# Patient Record
Sex: Male | Born: 1980 | Race: Black or African American | Hispanic: No | Marital: Single
Health system: Southern US, Community
[De-identification: ages and names within clinical notes are randomized; demographics above are authoritative.]

## PROBLEM LIST (undated history)

## (undated) ENCOUNTER — Emergency Department (HOSPITAL_COMMUNITY): Admission: EM | Payer: Self-pay | Source: Home / Self Care

## (undated) DIAGNOSIS — W3400XA Accidental discharge from unspecified firearms or gun, initial encounter: Secondary | ICD-10-CM

## (undated) HISTORY — PX: OTHER SURGICAL HISTORY: SHX169

---
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
## 2000-03-24 ENCOUNTER — Emergency Department (HOSPITAL_COMMUNITY): Admission: EM | Admit: 2000-03-24 | Discharge: 2000-03-24 | Payer: Self-pay | Admitting: Emergency Medicine

## 2002-12-22 ENCOUNTER — Emergency Department (HOSPITAL_COMMUNITY): Admission: EM | Admit: 2002-12-22 | Discharge: 2002-12-22 | Payer: Self-pay | Admitting: Emergency Medicine

## 2004-06-26 ENCOUNTER — Emergency Department (HOSPITAL_COMMUNITY): Admission: EM | Admit: 2004-06-26 | Discharge: 2004-06-26 | Payer: Self-pay

## 2004-10-28 ENCOUNTER — Emergency Department (HOSPITAL_COMMUNITY): Admission: EM | Admit: 2004-10-28 | Discharge: 2004-10-28 | Payer: Self-pay | Admitting: Family Medicine

## 2004-10-28 ENCOUNTER — Emergency Department (HOSPITAL_COMMUNITY): Admission: EM | Admit: 2004-10-28 | Discharge: 2004-10-28 | Payer: Self-pay | Admitting: Internal Medicine

## 2006-08-14 ENCOUNTER — Emergency Department (HOSPITAL_COMMUNITY): Admission: EM | Admit: 2006-08-14 | Discharge: 2006-08-14 | Payer: Self-pay | Admitting: Emergency Medicine

## 2006-09-07 ENCOUNTER — Inpatient Hospital Stay (HOSPITAL_COMMUNITY): Admission: AC | Admit: 2006-09-07 | Discharge: 2006-09-07 | Payer: Self-pay

## 2006-10-04 ENCOUNTER — Emergency Department (HOSPITAL_COMMUNITY): Admission: EM | Admit: 2006-10-04 | Discharge: 2006-10-04 | Payer: Self-pay | Admitting: Emergency Medicine

## 2007-07-19 ENCOUNTER — Emergency Department (HOSPITAL_COMMUNITY): Admission: EM | Admit: 2007-07-19 | Discharge: 2007-07-19 | Payer: Self-pay | Admitting: Emergency Medicine

## 2007-08-13 ENCOUNTER — Emergency Department (HOSPITAL_COMMUNITY): Admission: EM | Admit: 2007-08-13 | Discharge: 2007-08-13 | Payer: Self-pay | Admitting: Emergency Medicine

## 2007-12-28 ENCOUNTER — Emergency Department (HOSPITAL_COMMUNITY): Admission: EM | Admit: 2007-12-28 | Discharge: 2007-12-28 | Payer: Self-pay | Admitting: Emergency Medicine

## 2008-09-02 ENCOUNTER — Emergency Department (HOSPITAL_COMMUNITY): Admission: EM | Admit: 2008-09-02 | Discharge: 2008-09-02 | Payer: Self-pay | Admitting: Emergency Medicine

## 2009-05-23 ENCOUNTER — Emergency Department (HOSPITAL_COMMUNITY): Admission: EM | Admit: 2009-05-23 | Discharge: 2009-05-23 | Payer: Self-pay | Admitting: Emergency Medicine

## 2009-09-10 ENCOUNTER — Emergency Department (HOSPITAL_COMMUNITY): Admission: EM | Admit: 2009-09-10 | Discharge: 2009-09-10 | Payer: Self-pay | Admitting: Emergency Medicine

## 2009-09-13 ENCOUNTER — Emergency Department (HOSPITAL_COMMUNITY): Admission: EM | Admit: 2009-09-13 | Discharge: 2009-09-13 | Payer: Self-pay | Admitting: Family Medicine

## 2009-12-06 ENCOUNTER — Emergency Department (HOSPITAL_COMMUNITY): Admission: EM | Admit: 2009-12-06 | Discharge: 2009-12-06 | Payer: Self-pay | Admitting: Emergency Medicine

## 2010-12-25 LAB — DIFFERENTIAL
Basophils Absolute: 0 10*3/uL (ref 0.0–0.1)
Eosinophils Relative: 0 % (ref 0–5)
Monocytes Absolute: 0.6 10*3/uL (ref 0.1–1.0)
Neutro Abs: 3.4 10*3/uL (ref 1.7–7.7)
Neutrophils Relative %: 53 % (ref 43–77)

## 2010-12-25 LAB — URINALYSIS, ROUTINE W REFLEX MICROSCOPIC
Hgb urine dipstick: NEGATIVE
Protein, ur: NEGATIVE mg/dL
Specific Gravity, Urine: >1.03 — ABNORMAL HIGH (ref 1.005–1.030)
pH: 6 (ref 5.0–8.0)

## 2010-12-25 LAB — CBC
Hemoglobin: 14.1 g/dL (ref 13.0–17.0)
MCV: 88.5 fL (ref 78.0–100.0)
RBC: 4.71 MIL/uL (ref 4.22–5.81)
WBC: 6.4 10*3/uL (ref 4.0–10.5)

## 2010-12-25 LAB — COMPREHENSIVE METABOLIC PANEL
ALT: 16 U/L (ref 0–53)
Albumin: 4.1 g/dL (ref 3.5–5.2)
BUN: 9 mg/dL (ref 6–23)
GFR calc Af Amer: >60 mL/min (ref >60)

## 2010-12-25 LAB — GC/CHLAMYDIA PROBE AMP, URINE: GC Probe Amp, Urine: NEGATIVE

## 2011-06-20 LAB — COMPREHENSIVE METABOLIC PANEL
Albumin: 4.2 g/dL (ref 3.5–5.2)
BUN: 9 mg/dL (ref 6–23)
Creatinine, Ser: 1.34 mg/dL (ref 0.4–1.5)
GFR calc Af Amer: >60 mL/min (ref >60)
GFR calc non Af Amer: >60 mL/min (ref >60)
Sodium: 136 mEq/L (ref 135–145)
Total Bilirubin: 0.6 mg/dL (ref 0.3–1.2)

## 2011-06-20 LAB — CBC
MCHC: 34.6 g/dL (ref 30.0–36.0)
Platelets: 209 10*3/uL (ref 150–400)
RBC: 4.85 MIL/uL (ref 4.22–5.81)
WBC: 7.3 10*3/uL (ref 4.0–10.5)

## 2011-06-20 LAB — RAPID URINE DRUG SCREEN, HOSP PERFORMED
Amphetamines: NOT DETECTED
Barbiturates: NOT DETECTED
Benzodiazepines: NOT DETECTED
Cocaine: POSITIVE — AB
Opiates: NOT DETECTED
Tetrahydrocannabinol: POSITIVE — AB

## 2011-06-20 LAB — URINALYSIS, ROUTINE W REFLEX MICROSCOPIC
Leukocytes, UA: NEGATIVE
Nitrite: NEGATIVE
Protein, ur: >300 mg/dL — AB
Specific Gravity, Urine: 1.041 — ABNORMAL HIGH (ref 1.005–1.030)

## 2011-06-20 LAB — ETHANOL: Alcohol, Ethyl (B): 21 mg/dL — ABNORMAL HIGH (ref 0–10)

## 2011-06-20 LAB — URINE MICROSCOPIC-ADD ON

## 2011-06-20 LAB — DIFFERENTIAL
Basophils Absolute: 0.1 10*3/uL (ref 0.0–0.1)
Lymphs Abs: 2.7 10*3/uL (ref 0.7–4.0)

## 2011-10-10 ENCOUNTER — Encounter (HOSPITAL_COMMUNITY): Payer: Self-pay

## 2011-10-10 ENCOUNTER — Emergency Department (HOSPITAL_COMMUNITY)
Admission: EM | Admit: 2011-10-10 | Discharge: 2011-10-10 | Disposition: A | Discharge status: Home / Self Care | Payer: Self-pay | Attending: Emergency Medicine | Admitting: Emergency Medicine

## 2011-10-10 DIAGNOSIS — M795 Residual foreign body in soft tissue: Secondary | ICD-10-CM | POA: Insufficient documentation

## 2011-10-10 DIAGNOSIS — F17200 Nicotine dependence, unspecified, uncomplicated: Secondary | ICD-10-CM | POA: Insufficient documentation

## 2011-10-10 DIAGNOSIS — IMO0002 Reserved for concepts with insufficient information to code with codable children: Secondary | ICD-10-CM

## 2011-10-10 DIAGNOSIS — Z1889 Other specified retained foreign body fragments: Secondary | ICD-10-CM | POA: Insufficient documentation

## 2011-10-10 HISTORY — DX: Accidental discharge from unspecified firearms or gun, initial encounter: W34.00XA
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
## 2012-07-02 ENCOUNTER — Encounter (HOSPITAL_COMMUNITY): Payer: Self-pay | Admitting: *Deleted

## 2012-07-02 ENCOUNTER — Emergency Department (HOSPITAL_COMMUNITY)
Admission: EM | Admit: 2012-07-02 | Discharge: 2012-07-03 | Disposition: A | Discharge status: Home / Self Care | Payer: Self-pay | Attending: Emergency Medicine | Admitting: Emergency Medicine

## 2012-07-02 DIAGNOSIS — L732 Hidradenitis suppurativa: Secondary | ICD-10-CM

## 2012-07-02 DIAGNOSIS — F17200 Nicotine dependence, unspecified, uncomplicated: Secondary | ICD-10-CM | POA: Insufficient documentation

## 2012-07-02 DIAGNOSIS — IMO0002 Reserved for concepts with insufficient information to code with codable children: Secondary | ICD-10-CM | POA: Insufficient documentation

## 2012-07-02 NOTE — ED Notes (Signed)
Pt with abscess under right arm draining serosanguinous fluid.  Pt reports it has been there for a month.  Also patient has one on his upper left thigh.  Pt reports he has these chronically, but these are starting to make him feel bad all over.

## 2012-07-02 NOTE — ED Notes (Signed)
Pt c/o abscess under right arm x's 1 month.

## 2012-07-03 MED ORDER — HYDROCODONE-ACETAMINOPHEN 5-325 MG PO TABS: 1.0000 | ORAL_TABLET | Freq: Four times a day (QID) | ORAL | Status: DC | PRN

## 2012-07-03 MED ORDER — SULFAMETHOXAZOLE-TRIMETHOPRIM 800-160 MG PO TABS: 1.0000 | ORAL_TABLET | Freq: Two times a day (BID) | ORAL | Status: DC
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
## 2012-07-04 NOTE — ED Provider Notes (Signed)
Medical screening examination/treatment/procedure(s) were performed by non-physician practitioner and as supervising physician I was immediately available for consultation/collaboration.  Tania Perrott, MD 07/04/12 1119 

## 2012-07-06 ENCOUNTER — Encounter (HOSPITAL_COMMUNITY): Payer: Self-pay | Admitting: Emergency Medicine

## 2012-07-06 ENCOUNTER — Emergency Department (HOSPITAL_COMMUNITY)
Admission: EM | Admit: 2012-07-06 | Discharge: 2012-07-06 | Disposition: A | Discharge status: Home / Self Care | Payer: Self-pay | Attending: Emergency Medicine | Admitting: Emergency Medicine

## 2012-07-06 DIAGNOSIS — R109 Unspecified abdominal pain: Secondary | ICD-10-CM | POA: Insufficient documentation

## 2012-07-06 DIAGNOSIS — R1033 Periumbilical pain: Secondary | ICD-10-CM | POA: Insufficient documentation

## 2012-07-06 DIAGNOSIS — F17200 Nicotine dependence, unspecified, uncomplicated: Secondary | ICD-10-CM | POA: Insufficient documentation

## 2012-07-06 LAB — CBC WITH DIFFERENTIAL/PLATELET
Basophils Absolute: 0 10*3/uL (ref 0.0–0.1)
Basophils Relative: 0 % (ref 0–1)
Eosinophils Absolute: 0 10*3/uL (ref 0.0–0.7)
HCT: 41.2 % (ref 39.0–52.0)
Hemoglobin: 14.6 g/dL (ref 13.0–17.0)
MCHC: 35.4 g/dL (ref 30.0–36.0)
MCV: 85.5 fL (ref 78.0–100.0)
Neutro Abs: 3.8 10*3/uL (ref 1.7–7.7)
RBC: 4.82 MIL/uL (ref 4.22–5.81)
WBC: 6.1 10*3/uL (ref 4.0–10.5)

## 2012-07-06 LAB — URINALYSIS, MICROSCOPIC ONLY
Leukocytes, UA: NEGATIVE
Nitrite: NEGATIVE
Specific Gravity, Urine: 1.031 — ABNORMAL HIGH (ref 1.005–1.030)

## 2012-07-06 LAB — COMPREHENSIVE METABOLIC PANEL
Alkaline Phosphatase: 109 U/L (ref 39–117)
BUN: 15 mg/dL (ref 6–23)
Chloride: 101 mEq/L (ref 96–112)
Creatinine, Ser: 1.36 mg/dL — ABNORMAL HIGH (ref 0.50–1.35)
GFR calc Af Amer: 79 mL/min — ABNORMAL LOW (ref >90)
GFR calc non Af Amer: 68 mL/min — ABNORMAL LOW (ref >90)
Potassium: 4.5 mEq/L (ref 3.5–5.1)
Sodium: 136 mEq/L (ref 135–145)
Total Bilirubin: 0.2 mg/dL — ABNORMAL LOW (ref 0.3–1.2)

## 2012-07-06 MED ORDER — GI COCKTAIL ~~LOC~~
30.0000 mL | Freq: Once | ORAL | Status: AC
  Administered 2012-07-06: 30 mL via ORAL
  Filled 2012-07-06: qty 30

## 2012-07-06 MED ORDER — KETOROLAC TROMETHAMINE 30 MG/ML IJ SOLN
30.0000 mg | Freq: Once | INTRAMUSCULAR | Status: AC
  Administered 2012-07-06: 30 mg via INTRAVENOUS
  Filled 2012-07-06: qty 1

## 2012-07-06 NOTE — ED Provider Notes (Signed)
Medical screening examination/treatment/procedure(s) were performed by non-physician practitioner and as supervising physician I was immediately available for consultation/collaboration.   Ruthel Martine, MD 07/06/12 2352 
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
## 2012-07-06 NOTE — ED Notes (Signed)
Pt AAOx4, pacing in room.

## 2013-01-13 ENCOUNTER — Emergency Department (HOSPITAL_COMMUNITY)
Admission: EM | Admit: 2013-01-13 | Discharge: 2013-01-13 | Disposition: A | Discharge status: Home / Self Care | Payer: Self-pay | Attending: Emergency Medicine | Admitting: Emergency Medicine

## 2013-01-13 DIAGNOSIS — R197 Diarrhea, unspecified: Secondary | ICD-10-CM | POA: Insufficient documentation

## 2013-01-13 DIAGNOSIS — Z87828 Personal history of other (healed) physical injury and trauma: Secondary | ICD-10-CM | POA: Insufficient documentation

## 2013-01-13 DIAGNOSIS — F1010 Alcohol abuse, uncomplicated: Secondary | ICD-10-CM | POA: Insufficient documentation

## 2013-01-13 DIAGNOSIS — F17200 Nicotine dependence, unspecified, uncomplicated: Secondary | ICD-10-CM | POA: Insufficient documentation

## 2013-01-13 DIAGNOSIS — R1110 Vomiting, unspecified: Secondary | ICD-10-CM | POA: Insufficient documentation

## 2013-01-13 DIAGNOSIS — K852 Alcohol induced acute pancreatitis without necrosis or infection: Secondary | ICD-10-CM

## 2013-01-13 DIAGNOSIS — K859 Acute pancreatitis without necrosis or infection, unspecified: Secondary | ICD-10-CM | POA: Insufficient documentation

## 2013-01-13 LAB — CBC WITH DIFFERENTIAL/PLATELET
Basophils Relative: 0 % (ref 0–1)
HCT: 40.4 % (ref 39.0–52.0)
Hemoglobin: 13.8 g/dL (ref 13.0–17.0)
Lymphs Abs: 2.8 10*3/uL (ref 0.7–4.0)
MCH: 29.1 pg (ref 26.0–34.0)
MCHC: 34.2 g/dL (ref 30.0–36.0)
Monocytes Absolute: 0.5 10*3/uL (ref 0.1–1.0)
Monocytes Relative: 8 % (ref 3–12)
Neutro Abs: 3.1 10*3/uL (ref 1.7–7.7)
RBC: 4.75 MIL/uL (ref 4.22–5.81)

## 2013-01-13 LAB — COMPREHENSIVE METABOLIC PANEL
Albumin: 4 g/dL (ref 3.5–5.2)
Alkaline Phosphatase: 106 U/L (ref 39–117)
BUN: 12 mg/dL (ref 6–23)
Chloride: 103 mEq/L (ref 96–112)
Creatinine, Ser: 1.22 mg/dL (ref 0.50–1.35)
GFR calc Af Amer: >90 mL/min (ref >90)
GFR calc non Af Amer: 78 mL/min — ABNORMAL LOW (ref >90)
Glucose, Bld: 87 mg/dL (ref 70–99)
Total Bilirubin: 0.3 mg/dL (ref 0.3–1.2)

## 2013-01-13 LAB — URINALYSIS, MICROSCOPIC ONLY
Bilirubin Urine: NEGATIVE
Ketones, ur: NEGATIVE mg/dL
Nitrite: NEGATIVE
Protein, ur: NEGATIVE mg/dL
Urobilinogen, UA: 0.2 mg/dL (ref 0.0–1.0)
pH: 6 (ref 5.0–8.0)

## 2013-01-13 LAB — LIPASE, BLOOD: Lipase: 197 U/L — ABNORMAL HIGH (ref 11–59)

## 2013-01-13 MED ORDER — TRAMADOL HCL 50 MG PO TABS: 50.0000 mg | ORAL_TABLET | Freq: Four times a day (QID) | ORAL | Status: DC | PRN

## 2013-01-13 MED ORDER — SODIUM CHLORIDE 0.9 % IV BOLUS (SEPSIS)
1000.0000 mL | Freq: Once | INTRAVENOUS | Status: AC
Start: 2013-01-13 — End: 2013-01-13
  Administered 2013-01-13: 1000 mL via INTRAVENOUS

## 2013-01-13 NOTE — ED Notes (Signed)
Pt able to tolerate PO fluids.  PA made aware.
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
## 2013-01-13 NOTE — ED Notes (Signed)
Pt reports abdominal pain 9/10 x1 week. Vomited x2 today. Denies dysuria.

## 2013-01-15 NOTE — ED Provider Notes (Signed)
Medical screening examination/treatment/procedure(s) were performed by non-physician practitioner and as supervising physician I was immediately available for consultation/collaboration.   Gwyneth Sprout, MD 01/15/13 2245

## 2013-10-13 ENCOUNTER — Encounter (HOSPITAL_COMMUNITY): Payer: Self-pay | Admitting: Emergency Medicine

## 2013-10-13 ENCOUNTER — Emergency Department (HOSPITAL_COMMUNITY)
Admission: EM | Admit: 2013-10-13 | Discharge: 2013-10-13 | Disposition: A | Discharge status: Home / Self Care | Payer: Self-pay | Attending: Emergency Medicine | Admitting: Emergency Medicine

## 2013-10-13 DIAGNOSIS — H6120 Impacted cerumen, unspecified ear: Secondary | ICD-10-CM | POA: Insufficient documentation

## 2013-10-13 DIAGNOSIS — Z8781 Personal history of (healed) traumatic fracture: Secondary | ICD-10-CM | POA: Insufficient documentation

## 2013-10-13 DIAGNOSIS — Z87891 Personal history of nicotine dependence: Secondary | ICD-10-CM | POA: Insufficient documentation

## 2013-10-13 DIAGNOSIS — R6884 Jaw pain: Secondary | ICD-10-CM

## 2013-10-13 DIAGNOSIS — K029 Dental caries, unspecified: Secondary | ICD-10-CM | POA: Insufficient documentation

## 2013-10-13 DIAGNOSIS — Z9889 Other specified postprocedural states: Secondary | ICD-10-CM | POA: Insufficient documentation

## 2013-10-13 MED ORDER — IBUPROFEN 600 MG PO TABS: 600.0000 mg | ORAL_TABLET | Freq: Four times a day (QID) | ORAL | Status: DC | PRN

## 2013-10-13 MED ORDER — HYDROCODONE-ACETAMINOPHEN 5-325 MG PO TABS: 1.0000 | ORAL_TABLET | Freq: Four times a day (QID) | ORAL | Status: DC | PRN

## 2013-10-13 MED ORDER — AMOXICILLIN 500 MG PO CAPS: 500.0000 mg | ORAL_CAPSULE | Freq: Three times a day (TID) | ORAL | Status: DC

## 2013-10-13 NOTE — Discharge Instructions (Signed)
Ibuprofen for pain. norco for severe pain. Amoxil for infection until all gone. Follow up with an oral surgeon or ENT doctor.    Dental Pain A tooth ache may be caused by cavities (tooth decay). Cavities expose the nerve of the tooth to air and hot or cold temperatures. It may come from an infection or abscess (also called a boil or furuncle) around your tooth. It is also often caused by dental caries (tooth decay). This causes the pain you are having. DIAGNOSIS  Your caregiver can diagnose this problem by exam. TREATMENT   If caused by an infection, it may be treated with medications which kill germs (antibiotics) and pain medications as prescribed by your caregiver. Take medications as directed.  Only take over-the-counter or prescription medicines for pain, discomfort, or fever as directed by your caregiver.  Whether the tooth ache today is caused by infection or dental disease, you should see your dentist as soon as possible for further care. SEEK MEDICAL CARE IF: The exam and treatment you received today has been provided on an emergency basis only. This is not a substitute for complete medical or dental care. If your problem worsens or new problems (symptoms) appear, and you are unable to meet with your dentist, call or return to this location. SEEK IMMEDIATE MEDICAL CARE IF:   You have a fever.  You develop redness and swelling of your face, jaw, or neck.  You are unable to open your mouth.  You have severe pain uncontrolled by pain medicine. MAKE SURE YOU:   Understand these instructions.  Will watch your condition.  Will get help right away if you are not doing well or get worse. Document Released: 09/10/2005 Document Revised: 12/03/2011 Document Reviewed: 04/28/2008 [facility] Patient Information 2014 [employer], [place].
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
## 2013-10-13 NOTE — ED Notes (Signed)
Pt.been called twice no answer
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
## 2013-10-18 NOTE — ED Provider Notes (Signed)
Medical screening examination/treatment/procedure(s) were performed by non-physician practitioner and as supervising physician I was immediately available for consultation/collaboration.  EKG Interpretation   None        Haddie Bruhl W. Selmer Adduci, MD 10/18/13 1822 

## 2014-03-27 ENCOUNTER — Emergency Department (HOSPITAL_COMMUNITY)
Admission: EM | Admit: 2014-03-27 | Discharge: 2014-03-27 | Disposition: A | Discharge status: Home / Self Care | Payer: Self-pay | Attending: Emergency Medicine | Admitting: Emergency Medicine

## 2014-03-27 DIAGNOSIS — F411 Generalized anxiety disorder: Secondary | ICD-10-CM | POA: Insufficient documentation

## 2014-03-27 DIAGNOSIS — Z87828 Personal history of other (healed) physical injury and trauma: Secondary | ICD-10-CM | POA: Insufficient documentation

## 2014-03-27 DIAGNOSIS — Z87891 Personal history of nicotine dependence: Secondary | ICD-10-CM | POA: Insufficient documentation

## 2014-03-27 DIAGNOSIS — F1290 Cannabis use, unspecified, uncomplicated: Secondary | ICD-10-CM

## 2014-03-27 DIAGNOSIS — Z792 Long term (current) use of antibiotics: Secondary | ICD-10-CM | POA: Insufficient documentation

## 2014-03-27 DIAGNOSIS — F1410 Cocaine abuse, uncomplicated: Secondary | ICD-10-CM | POA: Insufficient documentation

## 2014-03-27 DIAGNOSIS — F419 Anxiety disorder, unspecified: Secondary | ICD-10-CM

## 2014-03-27 DIAGNOSIS — F1210 Cannabis abuse, uncomplicated: Secondary | ICD-10-CM | POA: Insufficient documentation

## 2014-03-27 LAB — CBC
HCT: 41.6 % (ref 39.0–52.0)
Hemoglobin: 14.2 g/dL (ref 13.0–17.0)
MCH: 28.8 pg (ref 26.0–34.0)
MCHC: 34.1 g/dL (ref 30.0–36.0)
MCV: 84.4 fL (ref 78.0–100.0)
PLATELETS: 206 10*3/uL (ref 150–400)
RBC: 4.93 MIL/uL (ref 4.22–5.81)
RDW: 13.3 % (ref 11.5–15.5)
WBC: 9.4 10*3/uL (ref 4.0–10.5)

## 2014-03-27 LAB — RAPID URINE DRUG SCREEN, HOSP PERFORMED
AMPHETAMINES: NOT DETECTED
Barbiturates: NOT DETECTED
Benzodiazepines: NOT DETECTED
Cocaine: POSITIVE — AB
OPIATES: NOT DETECTED
TETRAHYDROCANNABINOL: POSITIVE — AB

## 2014-03-27 LAB — COMPREHENSIVE METABOLIC PANEL
ALT: 12 U/L (ref 0–53)
AST: 14 U/L (ref 0–37)
Albumin: 4.2 g/dL (ref 3.5–5.2)
Alkaline Phosphatase: 110 U/L (ref 39–117)
Anion gap: 12 (ref 5–15)
BUN: 17 mg/dL (ref 6–23)
CALCIUM: 10.2 mg/dL (ref 8.4–10.5)
CHLORIDE: 101 meq/L (ref 96–112)
CO2: 27 meq/L (ref 19–32)
Creatinine, Ser: 1.46 mg/dL — ABNORMAL HIGH (ref 0.50–1.35)
GFR calc Af Amer: 71 mL/min — ABNORMAL LOW (ref >90)
GFR, EST NON AFRICAN AMERICAN: 62 mL/min — AB (ref >90)
Glucose, Bld: 127 mg/dL — ABNORMAL HIGH (ref 70–99)
Potassium: 4 mEq/L (ref 3.7–5.3)
SODIUM: 140 meq/L (ref 137–147)
Total Bilirubin: 0.2 mg/dL — ABNORMAL LOW (ref 0.3–1.2)
Total Protein: 8.1 g/dL (ref 6.0–8.3)

## 2014-03-27 LAB — ETHANOL: Alcohol, Ethyl (B): <11 mg/dL (ref 0–11)
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
## 2014-03-27 NOTE — ED Notes (Signed)
Pt transported via EMS from home for vague c/o "psych evaluation" pt stating he doesn't feel right.

## 2014-03-27 NOTE — ED Notes (Signed)
PT states he took a "drink" last night and is feeling strange. Denies si/hi. Pt alert and oriented.

## 2014-03-27 NOTE — Discharge Instructions (Signed)
Panic Attacks °Panic attacks are sudden, short-lived surges of severe anxiety, fear, or discomfort. They may occur for no reason when you are relaxed, when you are anxious, or when you are sleeping. Panic attacks may occur for a number of reasons:  °· Healthy people occasionally have panic attacks in extreme, life-threatening situations, such as war or natural disasters. Normal anxiety is a protective mechanism of the body that helps us react to danger (fight or flight response). °· Panic attacks are often seen with anxiety disorders, such as panic disorder, social anxiety disorder, generalized anxiety disorder, and phobias. Anxiety disorders cause excessive or uncontrollable anxiety. They may interfere with your relationships or other life activities. °· Panic attacks are sometimes seen with other mental illnesses, such as depression and posttraumatic stress disorder. °· Certain medical conditions, prescription medicines, and drugs of abuse can cause panic attacks. °SYMPTOMS  °Panic attacks start suddenly, peak within 20 minutes, and are accompanied by four or more of the following symptoms: °· Pounding heart or fast heart rate (palpitations). °· Sweating. °· Trembling or shaking. °· Shortness of breath or feeling smothered. °· Feeling choked. °· Chest pain or discomfort. °· Nausea or strange feeling in your stomach. °· Dizziness, light-headedness, or feeling like you will faint. °· Chills or hot flushes. °· Numbness or tingling in your lips or hands and feet. °· Feeling that things are not real or feeling that you are not yourself. °· Fear of losing control or going crazy. °· Fear of dying. °Some of these symptoms can mimic serious medical conditions. For example, you may think you are having a heart attack. Although panic attacks can be very scary, they are not life threatening. °DIAGNOSIS  °Panic attacks are diagnosed through an assessment by your health care provider. Your health care provider will ask  questions about your symptoms, such as where and when they occurred. Your health care provider will also ask about your medical history and use of alcohol and drugs, including prescription medicines. Your health care provider may order blood tests or other studies to rule out a serious medical condition. Your health care provider may refer you to a mental health professional for further evaluation. °TREATMENT  °· Most healthy people who have one or two panic attacks in an extreme, life-threatening situation will not require treatment. °· The treatment for panic attacks associated with anxiety disorders or other mental illness typically involves counseling with a mental health professional, medicine, or a combination of both. Your health care provider will help determine what treatment is best for you. °· Panic attacks due to physical illness usually go away with treatment of the illness. If prescription medicine is causing panic attacks, talk with your health care provider about stopping the medicine, decreasing the dose, or substituting another medicine. °· Panic attacks due to alcohol or drug abuse go away with abstinence. Some adults need professional help in order to stop drinking or using drugs. °HOME CARE INSTRUCTIONS  °· Take all medicines as directed by your health care provider.   °· Schedule and attend follow-up visits as directed by your health care provider. It is important to keep all your appointments. °SEEK MEDICAL CARE IF: °· You are not able to take your medicines as prescribed. °· Your symptoms do not improve or get worse. °SEEK IMMEDIATE MEDICAL CARE IF:  °· You experience panic attack symptoms that are different than your usual symptoms. °· You have serious thoughts about hurting yourself or others. °· You are taking medicine for panic attacks and   have a serious side effect. MAKE SURE YOU:  Understand these instructions.  Will watch your condition.  Will get help right away if you are not  doing well or get worse. Document Released: 09/10/2005 Document Revised: 09/15/2013 Document Reviewed: 04/24/2013 [facility] Patient Information 2015 [place], [place]. This information is not intended to replace advice given to you by your health care provider. Make sure you discuss any questions you have with your health care provider.  Marijuana Abuse and Chemical Dependency WHEN IS DRUG USE A PROBLEM? Problems related to drug use usually begin with abuse of the substance and lead to dependency.  Abuse is repeated use of a drug with recurrent and significant negative consequences. Abuse happens anytime drug use is interfering with normal living activities including:   Failure to fulfill major obligations at work, school or home (poor work International aid/development worker, missing work or school and/or neglecting children and home).  Engaging in activities that are physically dangerous (driving a car or doing recreational activities such as swimming or rock climbing) while under the effects of the drug.  Recurrent drug-related legal problems (arrests for disorderly conduct or assault and battery).  Recurrent social or interpersonal problems caused or increased by the effects of the drug (arguments with family or friends, or physical fights). Dependency has two parts.   You first develop an emotional/psychological dependence. Psychological dependence develops when your mind tells you that the drug is needed. You come to believe it helps you cope with life.  This is usually followed by physical dependence which has developed when continuing increases of drugs are required to get the same feeling or "high." This may result in:  Withdrawal symptoms such as shakes or tremors.  The substance being over a longer period of time than intended.  An ongoing desire, or unsuccessful effort to, cut down or control the use.  Greater amounts of time spent getting the drug, using the drug or recovering from the effects of the  drug.  Important social, work or interests and activities are given up or reduced because or drug use.  Substance is used despite knowledge of ongoing physical (ulcers) or psychological (depression) problems. SIGNS OF CHEMICAL DEPENDENCY:  Friends or family say there is a problem.  Fighting when using drugs.  Having blackouts (not remembering what you do while using).  Feel sick from using drugs but continue using.  Lie about use or amounts of drugs used.  Need drugs to get you going.  Need drugs to relate to people or feel comfortable in social situations.  Use drugs to forget problems. A "yes" answered to any of the above signs of chemical dependency indicates there are problems. The longer the use of drugs continues, the greater the problems will become. If there is a family history of drug or alcohol use it is best not to experiment with drugs. Experimentation leads to tolerance. Addiction is followed by dependency where drugs are now needed not just to get high but to feel normal. Addiction cannot be cured but it can be stopped. This often requires outside help and the care of professionals. Treatment centers are listed in the yellow pages under: Cocaine, Narcotics, and Alcoholics anonymous. Most hospitals and clinics can refer you to a specialized care center. WHAT IS MARIJUANA? Marijuana is a plant which grows wild all over the world. The plant contains many chemicals but the active ingredient of the plant is THC (tetrahydrocannabinol). This is responsible for the "high" perceived by people using the drug. HOW IS  MARIJUANA USED? Marijuana is smoked, eaten in brownies or any other food, and drank as a tea. WHAT ARE THE EFFECTS OF MARIJUANA? Marijuana is a nervous system depressant which slows the thinking process. Because of this effect, users think marijuana has a calming effect. Actually what happens is the air carrying tubules in the lung become relaxed and allow more oxygen to  enter. This causes the user to feel high. The blood pressure falls so less blood reaches the brain and the heart speeds up. As the effects wear off the user becomes depressed. Some people become very paranoid during use. They feel as though people are out to get them. Periodic use can interfere with performance at school or work. Generally Marijuana use does not develop into a physical dependence, but it is very habit forming. Marijuana is also seen as a gateway to use of harder drugs. Strong habits such as using Marijuana, as with all drugs and addictions, can only be helped by stopping use of all chemicals. This is hard but may save your life.  OTHER HEALTH RISKS OF MARIJUANA AND DRUG USE ARE: The increased possibility of getting AIDS or hepatitis (liver inflammation).  HOW TO STAY DRUG FREE ONCE YOU HAVE QUIT USING:  Develop healthy activities and form friends who do not use drugs.  Stay away from the drug scene.  Tell the those who want you to use drugs you have other, better things to do.  Have ready excuses available about why you cannot use.  Attend 12-Step Meetings for support from other recovering people. FOR MORE HELP OR INFORMATION CONTACT YOUR LOCAL CAREGIVER, CLINIC, OR HOSPITAL. Document Released: 09/07/2000 Document Revised: 01/05/2013 Document Reviewed: 10/08/2007 [facility] Patient Information 2015 [place], [place]. This information is not intended to replace advice given to you by your health care provider. Make sure you discuss any questions you have with your health care provider.    Emergency Department Resource Guide 1) Find a Doctor and Pay Out of Pocket Although you won't have to find out who is covered by your insurance plan, it is a good idea to ask around and get recommendations. You will then need to call the office and see if the doctor you have chosen will accept you as a new patient and what types of options they offer for patients who are self-pay. Some doctors  offer discounts or will set up payment plans for their patients who do not have insurance, but you will need to ask so you aren't surprised when you get to your appointment.  2) Contact Your Local Health Department Not all health departments have doctors that can see patients for sick visits, but many do, so it is worth a call to see if yours does. If you don't know where your local health department is, you can check in your phone book. The CDC also has a tool to help you locate your state's health department, and many state websites also have listings of all of their local health departments.  3) Find a Walk-in Clinic If your illness is not likely to be very severe or complicated, you may want to try a walk in clinic. These are popping up all over the country in pharmacies, drugstores, and shopping centers. They're usually staffed by nurse practitioners or physician assistants that have been trained to treat common illnesses and complaints. They're usually fairly quick and inexpensive. However, if you have serious medical issues or chronic medical problems, these are probably not your best option.  No  Primary Care Doctor: - Call Health Connect at  [phone] - they can help you locate a primary care doctor that  accepts your insurance, provides certain services, etc. - Physician Referral Service- [phone]  Chronic Pain Problems: Organization         Address  Phone   Notes  [facility]  [phone] Patients need to be referred by their primary care doctor.   Medication Assistance: Organization         Address  Phone   Notes  [facility] Medication [facility] [address] [place], [place] [address] [phone] --Must be a resident of [facility] -- Must have NO insurance coverage whatsoever (no Medicaid/ Medicare, etc.) -- The pt. MUST have a primary care doctor that directs their care regularly and follows them in the community    [facility]ist  [phone]   [employer]  [phone]    Agencies that provide inexpensive medical care: Organization         Address  Phone   Notes  [facility]  [phone]   [facility]    [phone]   [facility] [address] [place], [place] [address] [phone]   [facility] [address] [place], [place] [phone]   [facility]anned Parenthood    [phone]   [facility]    [phone]   [facility]  [address], [place] Phone:  [phone], Fax:  [phone] Hours of Operation:  9 am - 6 pm, M-F.  Also accepts Medicaid/Medicare and self-pay.  [facility]  [address], [place] Phone: [phone], Fax: [phone]. Hours of Operation:  8:30 am - 5:30 pm, M-F.  Also accepts Medicaid and self-pay.  [facility] [place] [address] [place] Phone: [phone]   [facility] [address] Natasha Bence [place], [place] [phone], Ext. [phone] Mondays & Thursdays: 7-9 AM.  First 15 patients are seen on a first come, first serve basis.    Medicaid-accepting [facility] Providers:  Organization         Address  Phone   Notes  [facility] [address], [place] [phone] Also accepts self-pay patients.  [facility] [address] Laurell Josephs [place], [place]  [phone]   [facility] [address], [place] [phone]   [facility] [address], [place] [phone]   Renaye Rakers [address], [place]   [phone] Only accepts [place] patients after they have their name applied to their card.   Self-Pay (no insurance) in [facility]:  Organization         Address  Phone   Notes  Sickle Cell Patients, [facility] [address] [place], [place] [phone]   [facility] [address] [place], [place] [phone]   [facility] [place]  [address],  [phone]   [facility]/Dr. Osei-Bonsu  [address] [place] or [address], [place] [phone] Phone number for both [place] and [place] locations is the same.  [facility] [address] [place]inette Otto [phone]  [facility] [address], [place] or [address] [phone]   [facility] [address] [place], [place] [phone], phone; [phone], fax Sees patients 1st and 3rd Saturday of every month.  Must not qualify for public or private insurance (i.e. Medicaid, Medicare, [place] Health Choice, Veterans' Benefits)  Household income should be no more than 200% of the poverty level The clinic cannot treat you if you are pregnant or think you are pregnant  Sexually transmitted diseases are not treated at the clinic.    Dental Care: Organization         Address  Phone  Notes  [facility] [address] [place], [place] [phone] Accepts children up to age 65 who are enrolled in [place] or [place] Health Choice; pregnant women with a Medicaid card; and children who have applied for Medicaid or [place] Health Choice, but were declined, whose parents can pay a reduced fee at time of service.  [facility] of [facility]  [address], [place] [phone] Accepts children up to age 62 who are enrolled in [place] or [place] Health Choice; pregnant women with a Medicaid card; and children who have applied for Medicaid or [place] Health Choice, but were declined, whose parents can pay a reduced fee at time of service.  [facility]  [address]  [place], [place] [phone] Patients are seen by appointment only. Walk-ins are not accepted. [facility] will see patients 60 years of age and older. Monday - Tuesday (8am-5pm) Most Wednesdays (8:30-5pm) $30 per visit, cash only  [facility] Access [facility]  [address], [facility] [phone] Patients are seen by appointment only. Walk-ins are not accepted. [facility] will see patients 30 years of age and older. One Wednesday Evening (Monthly: Volunteer Based).  $30 per visit, cash only  [employer]  [phone] for adults; Children under age 25, call Graduate Pediatric Dentistry at [phone]. Children aged 56-14, please call [phone] to request a pediatric application.  Dental services are provided in all areas of dental care including fillings, crowns and bridges, complete and partial dentures, implants, gum treatment, root canals, and extractions. Preventive care is also provided. Treatment is provided to both adults and children. Patients are selected via a lottery and there is often a waiting list.   [facility] [address], [place]  [phone] [email or web address]   [facility] [address] [place], [place] [phone], Ext. [phone] Second and Fourth Thursday of each month, opens at 6:30 AM; Clinic ends at 9 AM.  Patients are seen on a first-come first-served basis, and a limited number are seen during each clinic.   [facility]  [address] Ether Griffins [place], [place] [phone]   Eligibility Requirements You must have lived in [place], [place], or [place] counties for at least the last three months.   You cannot be eligible for state or federal sponsored [place], including [employer], [place], or [employer].   You generally cannot be eligible for healthcare insurance through your employer.    How to apply: Eligibility screenings are held every Tuesday and  Wednesday afternoon from 1:00 pm until 4:00 pm. You do not need an appointment for the interview!  [facility] [address] [place], [place] [phone]  [facility]  [phone]   [facility]  [phone]   [facility]  [phone]    Behavioral Health Resources in the Community: Intensive Outpatient Programs Organization         Address  Phone  Notes  [facility] [address], [place], [place] [phone]   [facility] [address], [place], [place] [phone]   [facility] [address], [place], [place]  [phone]   [facility] [address],  [place], [place] [phone] or [phone]   Substance Abuse Resources Organization         Address  Phone  Notes  [facility]  [phone]   [facility]  [phone]   [facility]  [phone]   [facility]  [phone]   Residential & Outpatient Substance Abuse Program  [phone]   Psychological Services Organization         Address  Phone  Notes  [facility]  [phone]   [facility]  [phone]- [phone]   [facility] [address], [place] [phone] or [phone]    Mobile Crisis Teams Organization         Address  Phone  Notes  [facility], Mobile Crisis Care Unit  [phone]   [facility]  [address] [place], [place] [phone]   Doristine LocksSharon DeEsch [address] [place] [phone]    Self-Help/Support Groups Organization         Address  Phone             Notes  [facility] [place] - variety of support groups  [phone] Call for more information  Narcotics Anonymous (NA), [facility] [address], [place]  2 meetings at this location   Risk manageresidential  Treatment Programs Organization         Address  Phone  Notes  [facility] [address],    [place]  [phone]   [facility]  [address], [place] [phone], [place], [place] [phone]   [facility] [address], [place] [phone] Admissions: 8am-3pm M-F  [facility] [address]    [place], [place] [phone]   [facility] [address], [place], [place] [phone]   [facility] [address]  [place], [place] [phone]   [facility] - Intensive Outpatient [address], [place], [place] [phone]   [facility] ([facility] [address]  [place], [place] [phone] or [phone]   [facility] ([facility]) [address] [place], [place] [phone] Accepts Medicaid  [facility] [address]  [place] [phone] Substance Abuse/Addiction Treatment   [facility] Resources Organization         Address  Phone  Notes  [facility]  [phone]   Angie FavaJulie Brannon, PhD [address], [address] [place], [place]   [phone] or [phone]) [phone]   [facility]   [address] [place], [place] [phone]   [facility] [address], [place], [place] [phone]) [phone] Insurance/Medicaid/sponsorship through [employer] [facility]  [facility] [address]  [place], [place] [phone] Therapy/tele-psych/case  [facility] [address]   [place], [place] [phone]    Dr. Lolly Mustache  [phone]   [facility]  [employer] [facility] Dept. 1) [address], [place] 2) [address] [place] 3)  [address], [place] [phone]  [phone]   [facility] [place] Child Abuse Hotline [phone] or [phone] (After Hours)

## 2014-03-27 NOTE — ED Notes (Addendum)
Pt reports "just doesn't feel right." Pt describes "feeling dayshavoo and clear headedness " but denies auditory/visual hallucinations. Pt reports "thinking mean thoughts" but unable to provide example. Pt denies SI/HI. Pt denies pain. Pt request psychiatric evaluation.

## 2014-03-27 NOTE — ED Notes (Signed)
MD at bedside. 

## 2014-06-09 ENCOUNTER — Encounter (HOSPITAL_COMMUNITY): Payer: Self-pay | Admitting: Emergency Medicine

## 2014-06-09 ENCOUNTER — Emergency Department (HOSPITAL_COMMUNITY)
Admission: EM | Admit: 2014-06-09 | Discharge: 2014-06-09 | Disposition: A | Discharge status: Home / Self Care | Payer: Self-pay | Attending: Emergency Medicine | Admitting: Emergency Medicine

## 2014-06-09 DIAGNOSIS — Z87891 Personal history of nicotine dependence: Secondary | ICD-10-CM | POA: Insufficient documentation

## 2014-06-09 DIAGNOSIS — K068 Other specified disorders of gingiva and edentulous alveolar ridge: Secondary | ICD-10-CM

## 2014-06-09 DIAGNOSIS — K1370 Unspecified lesions of oral mucosa: Secondary | ICD-10-CM | POA: Insufficient documentation

## 2014-06-09 DIAGNOSIS — K089 Disorder of teeth and supporting structures, unspecified: Secondary | ICD-10-CM | POA: Insufficient documentation

## 2014-06-09 DIAGNOSIS — Z87828 Personal history of other (healed) physical injury and trauma: Secondary | ICD-10-CM | POA: Insufficient documentation

## 2014-06-09 DIAGNOSIS — R6884 Jaw pain: Secondary | ICD-10-CM | POA: Insufficient documentation

## 2014-06-09 MED ORDER — PENICILLIN V POTASSIUM 500 MG PO TABS: 500.0000 mg | ORAL_TABLET | Freq: Four times a day (QID) | ORAL | Status: AC

## 2014-06-09 MED ORDER — NAPROXEN 500 MG PO TABS: 500.0000 mg | ORAL_TABLET | Freq: Two times a day (BID) | ORAL | Status: DC

## 2014-06-09 NOTE — Discharge Instructions (Signed)
Dental Care and Dentist Visits °Dental care supports good overall health. Regular dental visits can also help you avoid dental pain, bleeding, infection, and other more serious health problems in the future. It is important to keep the mouth healthy because diseases in the teeth, gums, and other oral tissues can spread to other areas of the body. Some problems, such as diabetes, heart disease, and pre-term labor have been associated with poor oral health.  °See your dentist every 6 months. If you experience emergency problems such as a toothache or broken tooth, go to the dentist right away. If you see your dentist regularly, you may catch problems early. It is easier to be treated for problems in the early stages.  °WHAT TO EXPECT AT A DENTIST VISIT  °Your dentist will look for many common oral health problems and recommend proper treatment. At your regular dental visit, you can expect: °· Gentle cleaning of the teeth and gums. This includes scraping and polishing. This helps to remove the sticky substance around the teeth and gums (plaque). Plaque forms in the mouth shortly after eating. Over time, plaque hardens on the teeth as tartar. If tartar is not removed regularly, it can cause problems. Cleaning also helps remove stains. °· Periodic X-rays. These pictures of the teeth and supporting bone will help your dentist assess the health of your teeth. °· Periodic fluoride treatments. Fluoride is a natural mineral shown to help strengthen teeth. Fluoride treatment involves applying a fluoride gel or varnish to the teeth. It is most commonly done in children. °· Examination of the mouth, tongue, jaws, teeth, and gums to look for any oral health problems, such as: °¨ Cavities (dental caries). This is decay on the tooth caused by plaque, sugar, and acid in the mouth. It is best to catch a cavity when it is small. °¨ Inflammation of the gums caused by plaque buildup (gingivitis). °¨ Problems with the mouth or malformed  or misaligned teeth. °¨ Oral cancer or other diseases of the soft tissues or jaws.  °KEEP YOUR TEETH AND GUMS HEALTHY °For healthy teeth and gums, follow these general guidelines as well as your dentist's specific advice: °· Have your teeth professionally cleaned at the dentist every 6 months. °· Brush twice daily with a fluoride toothpaste. °· Floss your teeth daily.  °· Ask your dentist if you need fluoride supplements, treatments, or fluoride toothpaste. °· Eat a healthy diet. Reduce foods and drinks with added sugar. °· Avoid smoking. °TREATMENT FOR ORAL HEALTH PROBLEMS °If you have oral health problems, treatment varies depending on the conditions present in your teeth and gums. °· Your caregiver will most likely recommend good oral hygiene at each visit. °· For cavities, gingivitis, or other oral health disease, your caregiver will perform a procedure to treat the problem. This is typically done at a separate appointment. Sometimes your caregiver will refer you to another dental specialist for specific tooth problems or for surgery. °SEEK IMMEDIATE DENTAL CARE IF: °· You have pain, bleeding, or soreness in the gum, tooth, jaw, or mouth area. °· A permanent tooth becomes loose or separated from the gum socket. °· You experience a blow or injury to the mouth or jaw area. °Document Released: 05/23/2011 Document Revised: 12/03/2011 Document Reviewed: 05/23/2011 °ExitCare® Patient Information ©2015 [employer]. This information is not intended to replace advice given to you by your health care provider. Make sure you discuss any questions you have with your health care provider. ° °Emergency Department Resource Guide °1) Find a Doctor   and Pay Out of Pocket °Although you won't have to find out who is covered by your insurance plan, it is a good idea to ask around and get recommendations. You will then need to call the office and see if the doctor you have chosen will accept you as a new patient and what types of  options they offer for patients who are self-pay. Some doctors offer discounts or will set up payment plans for their patients who do not have insurance, but you will need to ask so you aren't surprised when you get to your appointment. ° °2) Contact Your Local Health Department °Not all health departments have doctors that can see patients for sick visits, but many do, so it is worth a call to see if yours does. If you don't know where your local health department is, you can check in your phone book. The CDC also has a tool to help you locate your state's health department, and many state websites also have listings of all of their local health departments. ° °3) Find a Walk-in Clinic °If your illness is not likely to be very severe or complicated, you may want to try a walk in clinic. These are popping up all over the country in pharmacies, drugstores, and shopping centers. They're usually staffed by nurse practitioners or physician assistants that have been trained to treat common illnesses and complaints. They're usually fairly quick and inexpensive. However, if you have serious medical issues or chronic medical problems, these are probably not your best option. ° °No Primary Care Doctor: °- Call [facility] at  832-8000 - they can help you locate a primary care doctor that  accepts your insurance, provides certain services, etc. °- Physician Referral Service- 1-800-533-3463 ° °Chronic Pain Problems: °Organization         Address  Phone   Notes  °[facility]  (336) 297-2271 Patients need to be referred by their primary care doctor.  ° °Medication Assistance: °Organization         Address  Phone   Notes  °[place] [facility] [address] °[place], [place] [address] (336) 641-8030 --Must be a resident of [place] °-- Must have NO insurance coverage whatsoever (no Medicaid/ Medicare, etc.) °-- The pt. MUST have a primary care doctor that directs  their care regularly and follows them in the community °  °[facility]  (866) 331-1348   °[employer]  (888) 892-1162   ° °Agencies that provide inexpensive medical care: °Organization         Address  Phone   Notes  °[facility]  (336) 832-8035   °[facility]    (336) 832-7272   °[facility] [address] °[place], [place] [address] (336) 832-4777   °[facility] [address], °[place] (336) 271-4999   °[facility]    (336) 373-0678   °[facility]    (336) 272-1050   °[facility] ° [address], [place] Phone:  (336) 832-4444, Fax:  (336) 832-4440 Hours of Operation:  9 am - 6 pm, M-F.  Also accepts Medicaid/Medicare and self-pay.  °[facility] ° [address], [place] Phone: (336) 832-3150, Fax: (336) 832-3151. Hours of Operation:  8:30 am - 5:30 pm, M-F.  Also accepts Medicaid and self-pay.  °[facility] [place] [address], [place] Phone: (336) 878-6027   °  [facility] [address], [place], [place] (336)723-1848, Ext. 123 Mondays & Thursdays: 7-9 AM.  First 15 patients are seen on a first come, first serve basis. °  ° °Medicaid-accepting [place] Providers: ° °Organization         Address  Phone   Notes  °[facility] [address], [place] (336) 641-2100 Also accepts self-pay patients.  °[facility] [address], [place] ° (336) 856-9996   °[facility] [address], [place] (336) 288-8857   °[facility] [address] [place] [address], [place] (336) 299-7000   °Veita Bland [address], [place]  ° (336) 373-1557 Only accepts [place] Access Medicaid patients after they have their name applied to their card.  ° °Self-Pay (no insurance) in [place]: ° °Organization          Address  Phone   Notes  °Sickle Cell Patients, [facility] [address], [place] (336) 832-1970   °[facility] [address], [place] (336) 832-4400   °[facility] [place] ° [address],  (336) 992-4800   °[facility]/Dr. Osei-Bonsu ° [address] [place] [address], [place] or [address], [place] (336) 841-8500 Phone number for both [place] and [place] locations is the same.  °[facility] [address], [place] (336) 299-0000   °[facility] [address] [place] [address], [place] or [address] (336) 852-7530 °(336) 878-2260   °[facility] [address],  (336) 350-1642, phone; (336) 294-5005, fax Sees patients 1st and 3rd Saturday of every month.  Must not qualify for public or private insurance (i.e. Medicaid, Medicare, [place] Health Choice, Veterans' Benefits) • Household income should be no more than 200% of the poverty level •The clinic cannot treat you if you are pregnant or think you are pregnant • Sexually transmitted diseases are not treated at the clinic.  ° ° °Dental Care: °Organization         Address  Phone  Notes  °[facility] [address],  (336) 641-6152 Accepts children up to age 21 who are enrolled in Medicaid or [place] Health Choice; pregnant women with a Medicaid card; and children who have applied for Medicaid or [place] Health Choice, but were declined, whose parents can pay a reduced fee at time of service.  °[facility] [place]  [address], [place] (336) 641-7733 Accepts children up to age 21 who are enrolled in Medicaid or [place] Health Choice; pregnant women with a Medicaid card; and children who have applied for Medicaid or [place] Health Choice, but were declined, whose parents can pay a reduced fee at time of  service.  °[facility] ° [address],  (336) 641-4533 Patients are seen by appointment only. Walk-ins are not accepted. [facility] will see patients 18 years of age and older. °Monday - Tuesday (8am-5pm) °Most Wednesdays (8:30-5pm) °$30 per visit, cash only  °[facility] ° [address], [place] (336) 641-4533 Patients are seen by appointment only. Walk-ins are not accepted. [facility] will see patients 18 years of age and older. °One Wednesday Evening (Monthly: Volunteer Based).  $30 per visit,   cash only  °[facility]  (919) 537-3737 for adults; Children under age 4, call Graduate Pediatric Dentistry at ([phone]) [phone]. Children aged 4-14, please call (919) [phone] to request a pediatric application. ° Dental services are provided in all areas of dental care including fillings, crowns and bridges, complete and partial dentures, implants, gum treatment, root canals, and extractions. Preventive care is also provided. Treatment is provided to both adults and children. °Patients are selected via a lottery and there is often a waiting list. °  °[facility] [address], °[place] ° (336) 763-8833 [email or web address] °  °[facility] [address], [place], [place] (336)723-1848, Ext. 123 Second and Fourth Thursday of each month, opens at 6:30 AM; Clinic ends at 9 AM.  Patients are seen on a first-come first-served basis, and a limited number are seen during each clinic.  ° °[facility] ° [address], [place], [place] (336) 723-7904   Eligibility Requirements °You must have lived in [place], [place], or [place] counties for at least the last three months. °  You cannot be eligible for state or federal sponsored healthcare insurance, including Veterans Administration, Medicaid, or Medicare. °  You generally cannot be eligible for healthcare insurance through your employer.   °  How to apply: °Eligibility screenings are held every Tuesday and Wednesday afternoon from 1:00 pm until 4:00 pm. You do not need an appointment for the interview!  °[facility] [address], [place], [place] 336-631-2330   °[facility]  336-342-8273   °[facility]  336-703-3100   °[facility]  336-570-6415   ° °Behavioral Health Resources in the Community: °Intensive Outpatient Programs °Organization         Address  Phone  Notes  °[facility] [address], [place], [place] 336-878-6098   °[facility] [address], [place], [place] 336-832-9800   °[facility] [address], [place], [place] ° 336-882-2125   °[facility] [address],  °[place], [place] 1-800-853-5163 or 336-641-4981   °Substance Abuse Resources °Organization         Address  Phone  Notes  °[facility]  336-882-2125   °[facility]  336-784-9470   °[facility]  336-285-9073   °[facility]  336-845-3988   °Residential & Outpatient Substance Abuse Program  1-800-659-3381   °Psychological Services °Organization         Address  Phone  Notes  °[facility]  336- 832-9600   °[facility]  336- 378-7881   °[facility] [address], [place] 1-800-853-5163 or 336-641-4981   ° °Mobile Crisis Teams °Organization         Address  Phone  Notes  °[facility], Mobile Crisis Care Unit  1-877-626-1772   °Assertive °[facility] ° [address] [place], [place] 336-834-9664   °Sharon DeEsch [address] °[place] 336-554-5454   ° °Self-Help/Support Groups °Organization         Address  Phone             Notes  °[facility] - variety of support groups  336- 373-1402 Call for more information  °Narcotics Anonymous (NA), [facility] [address], °[place]   2 meetings at this location  ° °Residential Treatment Programs °Organization           Address  Phone  Notes  °[facility] [address],    °[place]  1-866-801-8205   °[facility] ° [address], [place], [place] 704-293-8524   °[facility] [address], [place] 336-845-3988 Admissions: 8am-3pm M-F  °[facility] [address]    °[place], [place] 336-841-1104   °[facility] [address], [place], [place] 336-379-7146   °[facility] [address]  °[place], [place] 336-285-9073   °[facility] - Intensive Outpatient [address], [place], [place] 336-852-3033   °[facility] [address]  °[place], [place] 1-877-615-2722 or 336-784-9470   °[facility] [address] [place], [place] 336-227-7417 Accepts Medicaid  °[facility] [address]  °[place] 1-800-659-3381 Substance Abuse/Addiction Treatment  ° °[place] Behavioral Health Resources °Organization         Address  Phone  Notes  °[facility]  (888) 581-9988   °Julie Brannon, PhD [address] [place], [place]   (336) 349-5553 or (336) 951-0000   °[facility]   [address] °[place], [place] (336) 349-4454   °[facility] [address], [place], [place] (336) 342-8316 Insurance/Medicaid/sponsorship through [facility]  °[facility] [address]                                    [place], [place] (336) 342-8316 Therapy/tele-psych/case  °[facility] [address]  ° [place], [place] (336) 349-2233    °Dr. Arfeen  (336) 349-4544   °[facility] [place]  [employer] [place] [facility] 1) [address], [place] °2) [address], [place] °3)  [address], [place] (336) 349-3220 °(336) 342-7768 ° °(336) 342-8140   °[place] [facility]  Hotline (336) 342-1394 or (336) 342-3537 (After Hours)    ° °

## 2014-06-09 NOTE — ED Notes (Signed)
Pt complains of right jaw pain. Pt states he has hx of dental abscess, which usually resolve with antibiotics. Pt states he is here to receive antibiotics and a note for work.
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
## 2014-06-10 NOTE — ED Provider Notes (Signed)
Medical screening examination/treatment/procedure(s) were performed by non-physician practitioner and as supervising physician I was immediately available for consultation/collaboration.   EKG Interpretation None       Juliet Rude. Rubin Payor, MD 06/10/14 1610

## 2015-10-03 ENCOUNTER — Emergency Department (HOSPITAL_COMMUNITY): Payer: Self-pay

## 2015-10-03 ENCOUNTER — Encounter (HOSPITAL_COMMUNITY): Payer: Self-pay | Admitting: Family Medicine

## 2015-10-03 ENCOUNTER — Emergency Department (HOSPITAL_COMMUNITY)
Admission: EM | Admit: 2015-10-03 | Discharge: 2015-10-03 | Disposition: A | Discharge status: Home / Self Care | Payer: Self-pay | Attending: Emergency Medicine | Admitting: Emergency Medicine

## 2015-10-03 DIAGNOSIS — S51811A Laceration without foreign body of right forearm, initial encounter: Secondary | ICD-10-CM | POA: Insufficient documentation

## 2015-10-03 DIAGNOSIS — Y9289 Other specified places as the place of occurrence of the external cause: Secondary | ICD-10-CM | POA: Insufficient documentation

## 2015-10-03 DIAGNOSIS — Z23 Encounter for immunization: Secondary | ICD-10-CM | POA: Insufficient documentation

## 2015-10-03 DIAGNOSIS — Z791 Long term (current) use of non-steroidal anti-inflammatories (NSAID): Secondary | ICD-10-CM | POA: Insufficient documentation

## 2015-10-03 DIAGNOSIS — Z87891 Personal history of nicotine dependence: Secondary | ICD-10-CM | POA: Insufficient documentation

## 2015-10-03 DIAGNOSIS — Y998 Other external cause status: Secondary | ICD-10-CM | POA: Insufficient documentation

## 2015-10-03 DIAGNOSIS — S61511A Laceration without foreign body of right wrist, initial encounter: Secondary | ICD-10-CM | POA: Insufficient documentation

## 2015-10-03 DIAGNOSIS — Y9389 Activity, other specified: Secondary | ICD-10-CM | POA: Insufficient documentation

## 2015-10-03 DIAGNOSIS — S41111A Laceration without foreign body of right upper arm, initial encounter: Secondary | ICD-10-CM

## 2015-10-03 MED ORDER — BACITRACIN ZINC 500 UNIT/GM EX OINT
TOPICAL_OINTMENT | CUTANEOUS | Status: AC
  Administered 2015-10-03: 1
  Filled 2015-10-03: qty 0.9

## 2015-10-03 MED ORDER — LIDOCAINE HCL (PF) 1 % IJ SOLN
5.0000 mL | Freq: Once | INTRAMUSCULAR | Status: AC
  Administered 2015-10-03: 5 mL via INTRADERMAL
  Filled 2015-10-03: qty 5

## 2015-10-03 MED ORDER — TETANUS-DIPHTH-ACELL PERTUSSIS 5-2.5-18.5 LF-MCG/0.5 IM SUSP
0.5000 mL | Freq: Once | INTRAMUSCULAR | Status: AC
  Administered 2015-10-03: 0.5 mL via INTRAMUSCULAR
  Filled 2015-10-03: qty 0.5
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
## 2015-10-03 NOTE — Discharge Instructions (Signed)
Laceration Care, Adult Take Tylenol as directed for pain. Wear the splint for 2 days. If you continue to have difficulty in moving your wrist, call Dr. Janee Mornhompson to schedule the next available appointment in his office. Your wounds should be rechecked in 2 days. Go to an urgent care to get them checked for infection. Wash the wounds daily with soap and water and then place a thin layer of bacitracin ointment over the wounds and cover with a sterile bandage. Return if your condition worsens for any reason. A laceration is a cut that goes through all layers of the skin. The cut also goes into the tissue that is right under the skin. Some cuts heal on their own. Others need to be closed with stitches (sutures), staples, skin adhesive strips, or wound glue. Taking care of your cut lowers your risk of infection and helps your cut to heal better. HOW TO TAKE CARE OF YOUR CUT For stitches or staples:  Keep the wound clean and dry.  If you were given a bandage (dressing), you should change it at least one time per day or as told by your doctor. You should also change it if it gets wet or dirty.  Keep the wound completely dry for the first 24 hours or as told by your doctor. After that time, you may take a shower or a bath. However, make sure that the wound is not soaked in water until after the stitches or staples have been removed.  Clean the wound one time each day or as told by your doctor:  Wash the wound with soap and water.  Rinse the wound with water until all of the soap comes off.  Pat the wound dry with a clean towel. Do not rub the wound.  After you clean the wound, put a thin layer of antibiotic ointment on it as told by your doctor. This ointment:  Helps to prevent infection.  Keeps the bandage from sticking to the wound.  Have your stitches or staples removed as told by your doctor. If your doctor used skin adhesive strips:   Keep the wound clean and dry.  If you were given a  bandage, you should change it at least one time per day or as told by your doctor. You should also change it if it gets dirty or wet.  Do not get the skin adhesive strips wet. You can take a shower or a bath, but be careful to keep the wound dry.  If the wound gets wet, pat it dry with a clean towel. Do not rub the wound.  Skin adhesive strips fall off on their own. You can trim the strips as the wound heals. Do not remove any strips that are still stuck to the wound. They will fall off after a while. If your doctor used wound glue:  Try to keep your wound dry, but you may briefly wet it in the shower or bath. Do not soak the wound in water, such as by swimming.  After you take a shower or a bath, gently pat the wound dry with a clean towel. Do not rub the wound.  Do not do any activities that will make you really sweaty until the skin glue has fallen off on its own.  Do not apply liquid, cream, or ointment medicine to your wound while the skin glue is still on.  If you were given a bandage, you should change it at least one time per day or as  told by your doctor. You should also change it if it gets dirty or wet.  If a bandage is placed over the wound, do not let the tape for the bandage touch the skin glue.  Do not pick at the glue. The skin glue usually stays on for 5-10 days. Then, it falls off of the skin. General Instructions  To help prevent scarring, make sure to cover your wound with sunscreen whenever you are outside after stitches are removed, after adhesive strips are removed, or when wound glue stays in place and the wound is healed. Make sure to wear a sunscreen of at least 30 SPF.  Take over-the-counter and prescription medicines only as told by your doctor.  If you were given antibiotic medicine or ointment, take or apply it as told by your doctor. Do not stop using the antibiotic even if your wound is getting better.  Do not scratch or pick at the wound.  Keep all  follow-up visits as told by your doctor. This is important.  Check your wound every day for signs of infection. Watch for:  Redness, swelling, or pain.  Fluid, blood, or pus.  Raise (elevate) the injured area above the level of your heart while you are sitting or lying down, if possible. GET HELP IF:  You got a tetanus shot and you have any of these problems at the injection site:  Swelling.  Very bad pain.  Redness.  Bleeding.  You have a fever.  A wound that was closed breaks open.  You notice a bad smell coming from your wound or your bandage.  You notice something coming out of the wound, such as wood or glass.  Medicine does not help your pain.  You have more redness, swelling, or pain at the site of your wound.  You have fluid, blood, or pus coming from your wound.  You notice a change in the color of your skin near your wound.  You need to change the bandage often because fluid, blood, or pus is coming from the wound.  You start to have a new rash.  You start to have numbness around the wound. GET HELP RIGHT AWAY IF:  You have very bad swelling around the wound.  Your pain suddenly gets worse and is very bad.  You notice painful lumps near the wound or on skin that is anywhere on your body.  You have a red streak going away from your wound.  The wound is on your hand or foot and you cannot move a finger or toe like you usually can.  The wound is on your hand or foot and you notice that your fingers or toes look pale or bluish.   This information is not intended to replace advice given to you by your health care provider. Make sure you discuss any questions you have with your health care provider.   Document Released: 02/27/2008 Document Revised: 01/25/2015 Document Reviewed: 09/06/2014 Elsevier Interactive Patient Education [employer]

## 2015-10-03 NOTE — ED Notes (Signed)
Jacubowitz at bedside. 
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
## 2015-10-03 NOTE — ED Notes (Addendum)
Bacitracin applied to puncture sites. Telfa applied to posterior forearm; Band-Aid applied to anterior forearm and wrist. All sites wrapped in tubed guaze for additional coverage.

## 2016-06-26 ENCOUNTER — Emergency Department (HOSPITAL_COMMUNITY)
Admission: EM | Admit: 2016-06-26 | Discharge: 2016-06-27 | Disposition: A | Discharge status: Home / Self Care | Payer: Self-pay | Attending: Emergency Medicine | Admitting: Emergency Medicine

## 2016-06-26 ENCOUNTER — Encounter (HOSPITAL_COMMUNITY): Payer: Self-pay

## 2016-06-26 DIAGNOSIS — Z87891 Personal history of nicotine dependence: Secondary | ICD-10-CM | POA: Insufficient documentation

## 2016-06-26 DIAGNOSIS — R400 Somnolence: Secondary | ICD-10-CM | POA: Insufficient documentation

## 2016-06-26 NOTE — ED Triage Notes (Signed)
Pt brought to ed lethargic. Pt wet on arrival sts he threw water on self to try to wake up. Pt sts he feels crazy, pupils pinpoint but pt will respond to loud stimuli

## 2016-06-27 MED ORDER — NALOXONE HCL 0.4 MG/ML IJ SOLN
INTRAMUSCULAR | Status: AC
  Filled 2016-06-27: qty 1

## 2016-06-27 MED ORDER — NALOXONE HCL 0.4 MG/ML IJ SOLN
0.4000 mg | Freq: Once | INTRAMUSCULAR | Status: AC
  Administered 2016-06-27: 0.4 mg via INTRAVENOUS

## 2016-06-27 NOTE — Discharge Instructions (Signed)
We suspect drug overdose as the likely cause of your symptoms. Seems like you have improved over the course of our observation, and so it is safe to discharge. Return to the ER if you get weak, have difficulty breathing, become unresponsive.
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
## 2017-04-30 ENCOUNTER — Encounter (HOSPITAL_COMMUNITY): Payer: Self-pay | Admitting: Emergency Medicine

## 2017-04-30 ENCOUNTER — Emergency Department (HOSPITAL_COMMUNITY)
Admission: EM | Admit: 2017-04-30 | Discharge: 2017-04-30 | Discharge status: Against Medical Advice | Payer: Self-pay | Attending: Emergency Medicine | Admitting: Emergency Medicine

## 2017-04-30 DIAGNOSIS — Z5321 Procedure and treatment not carried out due to patient leaving prior to being seen by health care provider: Secondary | ICD-10-CM | POA: Insufficient documentation

## 2017-04-30 DIAGNOSIS — R4182 Altered mental status, unspecified: Secondary | ICD-10-CM | POA: Insufficient documentation
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
## 2017-04-30 NOTE — ED Notes (Signed)
Fayrene FearingJames, MD entered room to assess pt, pt EKG leads laying on bed. Pt not in department. Pt left AMA.

## 2017-04-30 NOTE — ED Provider Notes (Signed)
At the beginning of my shift I was told by staff that this patient was prepared to leave.   I went to the room and found her gown, and EKG leads on the bed. Staff overhead paged the patient and paged for him at triage and he was nowhere to be found.  Triage note and nurse confirm that the patient was "awake and alert and oriented".  EKG without changes.   I did not have opportunity to interview or examine this patient.  Patient left with friends that escorted him here.   Antonio Andersen, Yari Szeliga, MD 04/30/17 [phone]

## 2017-04-30 NOTE — ED Notes (Signed)
Pt states that he is feeling fine and would like to leave Dr. Fayrene FearingJames was notified and said he would talk to the pt before pt left. Pt was told the Dr. Eulah CitizenWould see him before he left.

## 2018-04-06 ENCOUNTER — Emergency Department (HOSPITAL_COMMUNITY)
Admission: EM | Admit: 2018-04-06 | Discharge: 2018-04-06 | Disposition: A | Discharge status: Home / Self Care | Payer: Self-pay | Attending: Emergency Medicine | Admitting: Emergency Medicine

## 2018-04-06 ENCOUNTER — Other Ambulatory Visit: Payer: Self-pay

## 2018-04-06 DIAGNOSIS — L732 Hidradenitis suppurativa: Secondary | ICD-10-CM | POA: Insufficient documentation

## 2018-04-06 DIAGNOSIS — F17210 Nicotine dependence, cigarettes, uncomplicated: Secondary | ICD-10-CM | POA: Insufficient documentation

## 2018-04-06 MED ORDER — DOXYCYCLINE HYCLATE 100 MG PO CAPS: 100.0000 mg | ORAL_CAPSULE | Freq: Two times a day (BID) | ORAL | 0 refills | Status: DC

## 2018-04-06 MED ORDER — NAPROXEN 250 MG PO TABS
500.0000 mg | ORAL_TABLET | Freq: Once | ORAL | Status: AC
  Administered 2018-04-06: 500 mg via ORAL
  Filled 2018-04-06: qty 2

## 2018-04-06 MED ORDER — NAPROXEN 500 MG PO TABS: 500.0000 mg | ORAL_TABLET | Freq: Two times a day (BID) | ORAL | 0 refills | Status: DC

## 2018-04-06 MED ORDER — DOXYCYCLINE HYCLATE 100 MG PO TABS
100.0000 mg | ORAL_TABLET | Freq: Once | ORAL | Status: AC
  Administered 2018-04-06: 100 mg via ORAL
  Filled 2018-04-06: qty 1

## 2018-04-06 NOTE — ED Notes (Signed)
D/c reviewed with patient 

## 2018-04-06 NOTE — Discharge Instructions (Signed)
Take antibiotics as prescribed, use warm compresses, consider following up with surgeon to discuss definitive treatment options

## 2018-04-06 NOTE — ED Triage Notes (Signed)
Patient c/o multiple abscess in right axilla that have burst.
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
## 2018-09-09 ENCOUNTER — Encounter (HOSPITAL_COMMUNITY): Payer: Self-pay | Admitting: *Deleted

## 2018-09-09 ENCOUNTER — Other Ambulatory Visit: Payer: Self-pay

## 2018-09-09 DIAGNOSIS — Z5321 Procedure and treatment not carried out due to patient leaving prior to being seen by health care provider: Secondary | ICD-10-CM | POA: Insufficient documentation

## 2018-09-09 NOTE — ED Triage Notes (Signed)
Pt reports right sided jaw, neck and shoulder pain for about 3 weeks. Denies injury. Has been taking tylenol OTC for pain.

## 2018-09-10 ENCOUNTER — Emergency Department (HOSPITAL_COMMUNITY)
Admission: EM | Admit: 2018-09-10 | Discharge: 2018-09-10 | Discharge status: Against Medical Advice | Payer: Self-pay | Attending: Emergency Medicine | Admitting: Emergency Medicine

## 2018-09-14 ENCOUNTER — Encounter (HOSPITAL_BASED_OUTPATIENT_CLINIC_OR_DEPARTMENT_OTHER): Payer: Self-pay | Admitting: Emergency Medicine

## 2018-09-14 ENCOUNTER — Other Ambulatory Visit: Payer: Self-pay

## 2018-09-14 ENCOUNTER — Emergency Department (HOSPITAL_BASED_OUTPATIENT_CLINIC_OR_DEPARTMENT_OTHER)
Admission: EM | Admit: 2018-09-14 | Discharge: 2018-09-14 | Disposition: A | Discharge status: Home / Self Care | Payer: Self-pay | Attending: Emergency Medicine | Admitting: Emergency Medicine

## 2018-09-14 DIAGNOSIS — M5412 Radiculopathy, cervical region: Secondary | ICD-10-CM | POA: Insufficient documentation

## 2018-09-14 DIAGNOSIS — F17210 Nicotine dependence, cigarettes, uncomplicated: Secondary | ICD-10-CM | POA: Insufficient documentation

## 2018-09-14 MED ORDER — CYCLOBENZAPRINE HCL 10 MG PO TABS: 10.0000 mg | ORAL_TABLET | Freq: Two times a day (BID) | ORAL | 0 refills | Status: DC | PRN

## 2018-09-14 MED ORDER — IBUPROFEN 800 MG PO TABS: 800.0000 mg | ORAL_TABLET | Freq: Three times a day (TID) | ORAL | 0 refills | Status: DC

## 2018-09-14 NOTE — ED Triage Notes (Signed)
Pt here with right upper arm pain x 1 month that radiates to neck.

## 2018-09-14 NOTE — ED Notes (Signed)
ED Provider at bedside. 

## 2018-09-14 NOTE — Discharge Instructions (Signed)
1.  You should have a family doctor.  Use the referral number in your discharge instructions to find one. 2.  If your symptoms are not significantly improved by treatment, you may benefit from physical therapy.  You will need to have a family doctor to make referrals.
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
## 2018-09-29 ENCOUNTER — Encounter (HOSPITAL_BASED_OUTPATIENT_CLINIC_OR_DEPARTMENT_OTHER): Payer: Self-pay | Admitting: Emergency Medicine

## 2018-09-29 ENCOUNTER — Emergency Department (HOSPITAL_BASED_OUTPATIENT_CLINIC_OR_DEPARTMENT_OTHER)
Admission: EM | Admit: 2018-09-29 | Discharge: 2018-09-29 | Disposition: A | Discharge status: Home / Self Care | Payer: Self-pay | Attending: Emergency Medicine | Admitting: Emergency Medicine

## 2018-09-29 ENCOUNTER — Encounter (HOSPITAL_COMMUNITY): Payer: Self-pay | Admitting: Emergency Medicine

## 2018-09-29 ENCOUNTER — Emergency Department (HOSPITAL_COMMUNITY): Payer: Self-pay

## 2018-09-29 ENCOUNTER — Other Ambulatory Visit: Payer: Self-pay

## 2018-09-29 ENCOUNTER — Emergency Department (HOSPITAL_COMMUNITY)
Admission: EM | Admit: 2018-09-29 | Discharge: 2018-09-29 | Disposition: A | Discharge status: Home / Self Care | Payer: Self-pay | Attending: Emergency Medicine | Admitting: Emergency Medicine

## 2018-09-29 DIAGNOSIS — Z5321 Procedure and treatment not carried out due to patient leaving prior to being seen by health care provider: Secondary | ICD-10-CM | POA: Insufficient documentation

## 2018-09-29 DIAGNOSIS — F17210 Nicotine dependence, cigarettes, uncomplicated: Secondary | ICD-10-CM | POA: Insufficient documentation

## 2018-09-29 DIAGNOSIS — M25511 Pain in right shoulder: Secondary | ICD-10-CM | POA: Insufficient documentation

## 2018-09-29 DIAGNOSIS — M542 Cervicalgia: Secondary | ICD-10-CM | POA: Insufficient documentation

## 2018-09-29 MED ORDER — MELOXICAM 7.5 MG PO TABS: 7.5000 mg | ORAL_TABLET | Freq: Every day | ORAL | 0 refills | Status: DC

## 2018-09-29 MED ORDER — METHOCARBAMOL 500 MG PO TABS: 500.0000 mg | ORAL_TABLET | Freq: Two times a day (BID) | ORAL | 0 refills | Status: DC

## 2018-09-29 MED ORDER — METHOCARBAMOL 500 MG PO TABS
500.0000 mg | ORAL_TABLET | Freq: Once | ORAL | Status: AC
  Administered 2018-09-29: 500 mg via ORAL
  Filled 2018-09-29: qty 1

## 2018-09-29 NOTE — ED Triage Notes (Signed)
Right shoulder pain x 2.5 months.  Seen previously for same.

## 2018-09-29 NOTE — Discharge Instructions (Signed)
Do not drive while taking the muscle relaxer as it will make you sleepy. Follow up with [facility] if symptoms persist.

## 2018-09-29 NOTE — ED Triage Notes (Signed)
Pt c/o shoulder since before thanksgiving. Pt denies any falls or injuries or lifting.
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
## 2019-01-04 ENCOUNTER — Emergency Department (HOSPITAL_COMMUNITY)
Admission: EM | Admit: 2019-01-04 | Discharge: 2019-01-04 | Disposition: A | Discharge status: Home / Self Care | Payer: Self-pay | Attending: Emergency Medicine | Admitting: Emergency Medicine

## 2019-01-04 ENCOUNTER — Other Ambulatory Visit: Payer: Self-pay

## 2019-01-04 ENCOUNTER — Encounter (HOSPITAL_COMMUNITY): Payer: Self-pay | Admitting: Emergency Medicine

## 2019-01-04 ENCOUNTER — Emergency Department (HOSPITAL_COMMUNITY): Payer: Self-pay

## 2019-01-04 ENCOUNTER — Emergency Department (HOSPITAL_COMMUNITY)
Admission: EM | Admit: 2019-01-04 | Discharge: 2019-01-04 | Payer: Self-pay | Attending: Emergency Medicine | Admitting: Emergency Medicine

## 2019-01-04 ENCOUNTER — Encounter (HOSPITAL_COMMUNITY): Payer: Self-pay

## 2019-01-04 DIAGNOSIS — T189XXA Foreign body of alimentary tract, part unspecified, initial encounter: Secondary | ICD-10-CM | POA: Insufficient documentation

## 2019-01-04 DIAGNOSIS — R7401 Elevation of levels of liver transaminase levels: Secondary | ICD-10-CM

## 2019-01-04 DIAGNOSIS — Y939 Activity, unspecified: Secondary | ICD-10-CM | POA: Insufficient documentation

## 2019-01-04 DIAGNOSIS — Z79899 Other long term (current) drug therapy: Secondary | ICD-10-CM | POA: Insufficient documentation

## 2019-01-04 DIAGNOSIS — R740 Nonspecific elevation of levels of transaminase and lactic acid dehydrogenase [LDH]: Secondary | ICD-10-CM | POA: Insufficient documentation

## 2019-01-04 DIAGNOSIS — R000 Tachycardia, unspecified: Secondary | ICD-10-CM | POA: Insufficient documentation

## 2019-01-04 DIAGNOSIS — F17210 Nicotine dependence, cigarettes, uncomplicated: Secondary | ICD-10-CM | POA: Insufficient documentation

## 2019-01-04 DIAGNOSIS — R451 Restlessness and agitation: Secondary | ICD-10-CM | POA: Insufficient documentation

## 2019-01-04 DIAGNOSIS — T50902D Poisoning by unspecified drugs, medicaments and biological substances, intentional self-harm, subsequent encounter: Secondary | ICD-10-CM | POA: Insufficient documentation

## 2019-01-04 DIAGNOSIS — Y999 Unspecified external cause status: Secondary | ICD-10-CM | POA: Insufficient documentation

## 2019-01-04 DIAGNOSIS — Y929 Unspecified place or not applicable: Secondary | ICD-10-CM | POA: Insufficient documentation

## 2019-01-04 DIAGNOSIS — F1290 Cannabis use, unspecified, uncomplicated: Secondary | ICD-10-CM | POA: Insufficient documentation

## 2019-01-04 DIAGNOSIS — R030 Elevated blood-pressure reading, without diagnosis of hypertension: Secondary | ICD-10-CM | POA: Insufficient documentation

## 2019-01-04 DIAGNOSIS — X58XXXA Exposure to other specified factors, initial encounter: Secondary | ICD-10-CM | POA: Insufficient documentation

## 2019-01-04 DIAGNOSIS — R7309 Other abnormal glucose: Secondary | ICD-10-CM | POA: Insufficient documentation

## 2019-01-04 LAB — COMPREHENSIVE METABOLIC PANEL
ALT: 58 U/L — ABNORMAL HIGH (ref 0–44)
AST: 43 U/L — ABNORMAL HIGH (ref 15–41)
Albumin: 4 g/dL (ref 3.5–5.0)
Alkaline Phosphatase: 90 U/L (ref 38–126)
Anion gap: 10 (ref 5–15)
BUN: 14 mg/dL (ref 6–20)
CO2: 23 mmol/L (ref 22–32)
Calcium: 9.2 mg/dL (ref 8.9–10.3)
Chloride: 106 mmol/L (ref 98–111)
Creatinine, Ser: 1.42 mg/dL — ABNORMAL HIGH (ref 0.61–1.24)
GFR calc Af Amer: >60 mL/min (ref >60)
GFR calc non Af Amer: >60 mL/min (ref >60)
Glucose, Bld: 155 mg/dL — ABNORMAL HIGH (ref 70–99)
Potassium: 3.8 mmol/L (ref 3.5–5.1)
Sodium: 139 mmol/L (ref 135–145)
Total Bilirubin: 0.6 mg/dL (ref 0.3–1.2)
Total Protein: 7 g/dL (ref 6.5–8.1)

## 2019-01-04 LAB — MAGNESIUM: Magnesium: 2.3 mg/dL (ref 1.7–2.4)

## 2019-01-04 LAB — RAPID URINE DRUG SCREEN, HOSP PERFORMED
Amphetamines: NOT DETECTED
Barbiturates: NOT DETECTED
Benzodiazepines: POSITIVE — AB
Cocaine: POSITIVE — AB
Opiates: NOT DETECTED
Tetrahydrocannabinol: POSITIVE — AB

## 2019-01-04 LAB — ACETAMINOPHEN LEVEL: Acetaminophen (Tylenol), Serum: <10 ug/mL — ABNORMAL LOW (ref 10–30)

## 2019-01-04 LAB — SALICYLATE LEVEL: Salicylate Lvl: <7 mg/dL (ref 2.8–30.0)

## 2019-01-04 LAB — ETHANOL: Alcohol, Ethyl (B): <10 mg/dL (ref <10)

## 2019-01-04 MED ORDER — LORAZEPAM 2 MG/ML IJ SOLN
1.0000 mg | Freq: Once | INTRAMUSCULAR | Status: AC
  Administered 2019-01-04: 1 mg via INTRAVENOUS
  Filled 2019-01-04: qty 1

## 2019-01-04 MED ORDER — MAGNESIUM SULFATE 2 GM/50ML IV SOLN
2.0000 g | Freq: Once | INTRAVENOUS | Status: AC
  Administered 2019-01-04: 05:00:00 2 g via INTRAVENOUS
  Filled 2019-01-04: qty 50

## 2019-01-04 MED ORDER — HALOPERIDOL LACTATE 5 MG/ML IJ SOLN
5.0000 mg | Freq: Once | INTRAMUSCULAR | Status: AC
  Administered 2019-01-04: 07:00:00 5 mg via INTRAVENOUS
  Filled 2019-01-04: qty 1

## 2019-01-04 MED ORDER — SODIUM CHLORIDE 0.9 % IV BOLUS
1000.0000 mL | Freq: Once | INTRAVENOUS | Status: AC
  Administered 2019-01-04: 06:00:00 1000 mL via INTRAVENOUS

## 2019-01-04 MED ORDER — LABETALOL HCL 5 MG/ML IV SOLN
20.0000 mg | Freq: Once | INTRAVENOUS | Status: AC
  Administered 2019-01-04: 05:00:00 20 mg via INTRAVENOUS
  Filled 2019-01-04: qty 4
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
## 2019-01-04 NOTE — ED Notes (Signed)
ED Provider at bedside. 

## 2019-01-04 NOTE — ED Notes (Signed)
Pt still confused about why he is here and requires constant reminding not to get up.  Pt continues to try to get up to leave and using profanity.

## 2019-01-04 NOTE — ED Notes (Signed)
Pt stating again that he is ready to go home; Dr. Tomma Lightning at bedside

## 2019-01-04 NOTE — ED Notes (Signed)
Patient verbalizes understanding of discharge instructions. Opportunity for questioning and answers were provided. Armband removed by staff, pt discharged from ED.  

## 2019-01-04 NOTE — ED Notes (Addendum)
After consultation with Dr. Preston Fleeting, pt released from restraints; pt sleeping

## 2019-01-04 NOTE — ED Notes (Signed)
Pt's O2 sats observed to be in the 80's on room air; pt placed on 2L O2 via [place]; O2 sats now in the 90's 

## 2019-01-04 NOTE — ED Notes (Signed)
Pt agreeable to staying until 1800 per poison control's recommendation; pt given food and beverage per Dr. Dalene Seltzer

## 2019-01-04 NOTE — ED Provider Notes (Signed)
Care assumed from Dr. Dalene Seltzer at shift change.  Patient under observation after swallowing a marijuana cigarette to avoid incarceration.  12 hours of monitoring was recommended by poison control.  This will be complete at 1800.  Patient was reassessed at 1745 and is resting comfortably.  He is easily arousable and appropriate.  His vital signs are stable and he appears well.  Patient seems appropriate for discharge.   Geoffery Lyons, MD 01/04/19 1746

## 2019-01-04 NOTE — ED Triage Notes (Signed)
EMS reported Patient's systolic BP of >300

## 2019-01-04 NOTE — Discharge Instructions (Addendum)
Return to the emergency department if you develop chest pain, difficulty breathing, severe abdominal pain, or other new and concerning symptoms.
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
## 2019-01-04 NOTE — ED Notes (Signed)
Per Officer [employer], pt released from police custody

## 2019-01-04 NOTE — ED Triage Notes (Signed)
Arrival via EMS, Dillirium/ALOC IV access 5 mg total Versed.  Pt here earlier today after swallowing drugs (Marajuana)  Pt got to booking at [facility] and began having ALOC.

## 2019-01-04 NOTE — ED Notes (Signed)
Pt alert and stating that is he ready to go; Dr. Dalene Seltzer notified

## 2019-01-04 NOTE — ED Notes (Signed)
Patient verbalizes understanding of medications and discharge instructions. No further questions at this time. VSS and patient ambulatory at discharge.   Discharged into [employer] custody.
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
## 2019-01-04 NOTE — ED Provider Notes (Signed)
  Physical Exam  BP (!) 151/98   Pulse 70   Temp (!) 97.5 F (36.4 C) (Axillary)   Resp (!) 23   SpO2 100%   Physical Exam  ED Course/Procedures     Procedures  MDM  Received care of patient at 8AM from Dr. Preston Fleeting. Briefly, this is a 38 year old male who presented with agitation and hypertension after body stuffing. Initially came to ED at 230AM with stable vital signs after report of body stuffing one small ziplock bag of marijuana blunt. He then became agitated just before 5AM and was brought back to the ED agitated, tachycardic and hypertensive. Dr. Preston Fleeting saw him and gave ativan, haldol, Mg, NS, and labetalol. Currently sleeping with plan for reevaluation.    Evaluated patient, he is sleepy at this time and not back to baseline (9AM) but confirms swallowing single ziploc bag.   Discussed with poison control, who recommends observation 12 hours after last sedation/antipsychotic in case of continued or repeat absorption of drug from bag in GI tract.  Patient observed, will be stable for discharge if continued stability at Marin Olp, MD 01/04/19 [phone]

## 2019-06-13 ENCOUNTER — Other Ambulatory Visit: Payer: Self-pay

## 2019-06-13 ENCOUNTER — Emergency Department (HOSPITAL_COMMUNITY)
Admission: EM | Admit: 2019-06-13 | Discharge: 2019-06-13 | Disposition: A | Discharge status: Home / Self Care | Payer: Self-pay | Attending: Emergency Medicine | Admitting: Emergency Medicine

## 2019-06-13 ENCOUNTER — Emergency Department (HOSPITAL_COMMUNITY): Payer: Self-pay

## 2019-06-13 DIAGNOSIS — Y908 Blood alcohol level of 240 mg/100 ml or more: Secondary | ICD-10-CM | POA: Insufficient documentation

## 2019-06-13 DIAGNOSIS — Z79899 Other long term (current) drug therapy: Secondary | ICD-10-CM | POA: Insufficient documentation

## 2019-06-13 DIAGNOSIS — F1410 Cocaine abuse, uncomplicated: Secondary | ICD-10-CM | POA: Insufficient documentation

## 2019-06-13 DIAGNOSIS — F10920 Alcohol use, unspecified with intoxication, uncomplicated: Secondary | ICD-10-CM | POA: Insufficient documentation

## 2019-06-13 DIAGNOSIS — F17210 Nicotine dependence, cigarettes, uncomplicated: Secondary | ICD-10-CM | POA: Insufficient documentation

## 2019-06-13 DIAGNOSIS — F1210 Cannabis abuse, uncomplicated: Secondary | ICD-10-CM | POA: Insufficient documentation

## 2019-06-13 LAB — COMPREHENSIVE METABOLIC PANEL
ALT: 14 U/L (ref 0–44)
AST: 16 U/L (ref 15–41)
Albumin: 4.3 g/dL (ref 3.5–5.0)
Alkaline Phosphatase: 98 U/L (ref 38–126)
Anion gap: 12 (ref 5–15)
BUN: 10 mg/dL (ref 6–20)
CO2: 22 mmol/L (ref 22–32)
Calcium: 8.9 mg/dL (ref 8.9–10.3)
Chloride: 106 mmol/L (ref 98–111)
Creatinine, Ser: 1 mg/dL (ref 0.61–1.24)
GFR calc Af Amer: >60 mL/min (ref >60)
GFR calc non Af Amer: >60 mL/min (ref >60)
Glucose, Bld: 162 mg/dL — ABNORMAL HIGH (ref 70–99)
Potassium: 3.4 mmol/L — ABNORMAL LOW (ref 3.5–5.1)
Sodium: 140 mmol/L (ref 135–145)
Total Bilirubin: 0.6 mg/dL (ref 0.3–1.2)
Total Protein: 7.6 g/dL (ref 6.5–8.1)

## 2019-06-13 LAB — CBC WITH DIFFERENTIAL/PLATELET
Abs Immature Granulocytes: 0.03 10*3/uL (ref 0.00–0.07)
Basophils Absolute: 0.1 10*3/uL (ref 0.0–0.1)
Basophils Relative: 1 %
Eosinophils Absolute: 0 10*3/uL (ref 0.0–0.5)
Eosinophils Relative: 0 %
HCT: 46.9 % (ref 39.0–52.0)
Hemoglobin: 15.1 g/dL (ref 13.0–17.0)
Immature Granulocytes: 0 %
Lymphocytes Relative: 50 %
Lymphs Abs: 3.8 10*3/uL (ref 0.7–4.0)
MCH: 29 pg (ref 26.0–34.0)
MCHC: 32.2 g/dL (ref 30.0–36.0)
MCV: 90.2 fL (ref 80.0–100.0)
Monocytes Absolute: 0.6 10*3/uL (ref 0.1–1.0)
Monocytes Relative: 7 %
Neutro Abs: 3.2 10*3/uL (ref 1.7–7.7)
Neutrophils Relative %: 42 %
Platelets: 215 10*3/uL (ref 150–400)
RBC: 5.2 MIL/uL (ref 4.22–5.81)
RDW: 14.2 % (ref 11.5–15.5)
WBC: 7.7 10*3/uL (ref 4.0–10.5)
nRBC: 0 % (ref 0.0–0.2)

## 2019-06-13 LAB — SALICYLATE LEVEL: Salicylate Lvl: <7 mg/dL (ref 2.8–30.0)

## 2019-06-13 LAB — RAPID URINE DRUG SCREEN, HOSP PERFORMED
Amphetamines: NOT DETECTED
Barbiturates: NOT DETECTED
Benzodiazepines: NOT DETECTED
Cocaine: POSITIVE — AB
Opiates: NOT DETECTED
Tetrahydrocannabinol: POSITIVE — AB

## 2019-06-13 LAB — ACETAMINOPHEN LEVEL: Acetaminophen (Tylenol), Serum: <10 ug/mL — ABNORMAL LOW (ref 10–30)

## 2019-06-13 LAB — ETHANOL: Alcohol, Ethyl (B): 258 mg/dL — ABNORMAL HIGH (ref <10)

## 2019-06-13 MED ORDER — NALOXONE HCL 2 MG/2ML IJ SOSY
2.0000 mg | PREFILLED_SYRINGE | Freq: Once | INTRAMUSCULAR | Status: AC
  Administered 2019-06-13: 2 mg via INTRAVENOUS
  Filled 2019-06-13: qty 2

## 2019-06-13 NOTE — ED Provider Notes (Signed)
Assumed care of patient at 7 AM.  Patient came in altered with a blood alcohol level in the 200s.  Polysubstance abuse history.  Lab work and head imaging are unremarkable except for elevated alcohol.  Patient is now awake and alert.  Neurologically intact.  Admits to heavy alcohol use last night.  Normal vitals.  We will have him ambulate.  Patient was able to ambulate without any issues.  Discharged in the ED in good condition.  Educated about alcohol and drug use.  This chart was dictated using voice recognition software.  Despite best efforts to proofread,  errors can occur which can change the documentation meaning.     Lennice Sites, DO 06/13/19 [phone]

## 2019-06-13 NOTE — ED Triage Notes (Signed)
Pt comes to ed via ems, c/o etoh with possible other drug use. Pt is non responsive and stable, pt has a postive pain response. Pt pulled out his NPA.  Hr 60, rr 16 , cbg 180, spo2 92, room air, bp 110 / 70 to 94/61.  Antonio Andersen is his contact [phone].

## 2019-06-13 NOTE — ED Notes (Signed)
To CT and returned-VSS-patient asleep with snoring respirations

## 2019-06-13 NOTE — ED Notes (Signed)
Pt family member Miss Sherral Hammers called to say since she couldn't come in she is going home. Her number is in the chart and she can be contacted when pt wakes up and/or if he needs a way home.
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
## 2019-06-13 NOTE — ED Notes (Signed)
Patient arrives by [facility] unresponsive to verbal/noxious stimuli-O2 sat 88%-O2 applied 2l/Vanduser with sat increased 99%. Dr. Wyvonnia Dusky evaluated patient on EMS stretcher-PIV per J. Doren Custard RN and blood work drawn. Waiting for ED bed.

## 2019-12-06 ENCOUNTER — Emergency Department (HOSPITAL_COMMUNITY)
Admission: EM | Admit: 2019-12-06 | Discharge: 2019-12-06 | Disposition: A | Discharge status: Home / Self Care | Payer: Self-pay | Attending: Emergency Medicine | Admitting: Emergency Medicine

## 2019-12-06 ENCOUNTER — Emergency Department (HOSPITAL_COMMUNITY): Payer: Self-pay

## 2019-12-06 ENCOUNTER — Other Ambulatory Visit: Payer: Self-pay

## 2019-12-06 DIAGNOSIS — R569 Unspecified convulsions: Secondary | ICD-10-CM | POA: Insufficient documentation

## 2019-12-06 DIAGNOSIS — F17210 Nicotine dependence, cigarettes, uncomplicated: Secondary | ICD-10-CM | POA: Insufficient documentation

## 2019-12-06 DIAGNOSIS — Z20822 Contact with and (suspected) exposure to covid-19: Secondary | ICD-10-CM | POA: Insufficient documentation

## 2019-12-06 DIAGNOSIS — R404 Transient alteration of awareness: Secondary | ICD-10-CM | POA: Insufficient documentation

## 2019-12-06 DIAGNOSIS — Z5320 Procedure and treatment not carried out because of patient's decision for unspecified reasons: Secondary | ICD-10-CM | POA: Insufficient documentation

## 2019-12-06 DIAGNOSIS — Z79899 Other long term (current) drug therapy: Secondary | ICD-10-CM | POA: Insufficient documentation

## 2019-12-06 LAB — BLOOD GAS, VENOUS
Acid-base deficit: 4.7 mmol/L — ABNORMAL HIGH (ref 0.0–2.0)
Bicarbonate: 19.4 mmol/L — ABNORMAL LOW (ref 20.0–28.0)
O2 Saturation: 93.9 %
Patient temperature: 98.6
pCO2, Ven: 34.9 mmHg — ABNORMAL LOW (ref 44.0–60.0)
pH, Ven: 7.363 (ref 7.250–7.430)
pO2, Ven: 74.5 mmHg — ABNORMAL HIGH (ref 32.0–45.0)

## 2019-12-06 LAB — URINALYSIS, ROUTINE W REFLEX MICROSCOPIC
Bilirubin Urine: NEGATIVE
Glucose, UA: NEGATIVE mg/dL
Ketones, ur: NEGATIVE mg/dL
Leukocytes,Ua: NEGATIVE
Nitrite: NEGATIVE
Protein, ur: 100 mg/dL — AB
Specific Gravity, Urine: 1.012 (ref 1.005–1.030)
pH: 5 (ref 5.0–8.0)

## 2019-12-06 LAB — CBC WITH DIFFERENTIAL/PLATELET
Abs Immature Granulocytes: 0.31 10*3/uL — ABNORMAL HIGH (ref 0.00–0.07)
Basophils Absolute: 0.1 10*3/uL (ref 0.0–0.1)
Basophils Relative: 0 %
Eosinophils Absolute: 0 10*3/uL (ref 0.0–0.5)
Eosinophils Relative: 0 %
HCT: 44.8 % (ref 39.0–52.0)
Hemoglobin: 14.7 g/dL (ref 13.0–17.0)
Immature Granulocytes: 2 %
Lymphocytes Relative: 9 %
Lymphs Abs: 1.2 10*3/uL (ref 0.7–4.0)
MCH: 29.6 pg (ref 26.0–34.0)
MCHC: 32.8 g/dL (ref 30.0–36.0)
MCV: 90.3 fL (ref 80.0–100.0)
Monocytes Absolute: 0.6 10*3/uL (ref 0.1–1.0)
Monocytes Relative: 4 %
Neutro Abs: 11.9 10*3/uL — ABNORMAL HIGH (ref 1.7–7.7)
Neutrophils Relative %: 85 %
Platelets: 252 10*3/uL (ref 150–400)
RBC: 4.96 MIL/uL (ref 4.22–5.81)
RDW: 12.9 % (ref 11.5–15.5)
WBC: 14 10*3/uL — ABNORMAL HIGH (ref 4.0–10.5)
nRBC: 0 % (ref 0.0–0.2)

## 2019-12-06 LAB — ETHANOL: Alcohol, Ethyl (B): <10 mg/dL (ref <10)

## 2019-12-06 LAB — I-STAT CHEM 8, ED
BUN: 10 mg/dL (ref 6–20)
Calcium, Ion: 1.21 mmol/L (ref 1.15–1.40)
Chloride: 105 mmol/L (ref 98–111)
Creatinine, Ser: 1.7 mg/dL — ABNORMAL HIGH (ref 0.61–1.24)
Glucose, Bld: 131 mg/dL — ABNORMAL HIGH (ref 70–99)
HCT: 44 % (ref 39.0–52.0)
Hemoglobin: 15 g/dL (ref 13.0–17.0)
Potassium: 3.7 mmol/L (ref 3.5–5.1)
Sodium: 139 mmol/L (ref 135–145)
TCO2: 18 mmol/L — ABNORMAL LOW (ref 22–32)

## 2019-12-06 LAB — COMPREHENSIVE METABOLIC PANEL
ALT: 19 U/L (ref 0–44)
AST: 28 U/L (ref 15–41)
Albumin: 4.2 g/dL (ref 3.5–5.0)
Alkaline Phosphatase: 97 U/L (ref 38–126)
Anion gap: 21 — ABNORMAL HIGH (ref 5–15)
BUN: 11 mg/dL (ref 6–20)
CO2: 16 mmol/L — ABNORMAL LOW (ref 22–32)
Calcium: 9.5 mg/dL (ref 8.9–10.3)
Chloride: 101 mmol/L (ref 98–111)
Creatinine, Ser: 1.66 mg/dL — ABNORMAL HIGH (ref 0.61–1.24)
GFR calc Af Amer: 60 mL/min — ABNORMAL LOW (ref >60)
GFR calc non Af Amer: 52 mL/min — ABNORMAL LOW (ref >60)
Glucose, Bld: 134 mg/dL — ABNORMAL HIGH (ref 70–99)
Potassium: 3.6 mmol/L (ref 3.5–5.1)
Sodium: 138 mmol/L (ref 135–145)
Total Bilirubin: 0.9 mg/dL (ref 0.3–1.2)
Total Protein: 7.7 g/dL (ref 6.5–8.1)

## 2019-12-06 LAB — RAPID URINE DRUG SCREEN, HOSP PERFORMED
Amphetamines: NOT DETECTED
Barbiturates: NOT DETECTED
Benzodiazepines: POSITIVE — AB
Cocaine: POSITIVE — AB
Opiates: NOT DETECTED
Tetrahydrocannabinol: POSITIVE — AB

## 2019-12-06 LAB — LACTIC ACID, PLASMA
Lactic Acid, Venous: >11 mmol/L (ref 0.5–1.9)
Lactic Acid, Venous: 1.2 mmol/L (ref 0.5–1.9)

## 2019-12-06 LAB — ACETAMINOPHEN LEVEL: Acetaminophen (Tylenol), Serum: <10 ug/mL — ABNORMAL LOW (ref 10–30)

## 2019-12-06 LAB — POC SARS CORONAVIRUS 2 AG -  ED: SARS Coronavirus 2 Ag: NEGATIVE

## 2019-12-06 LAB — SALICYLATE LEVEL: Salicylate Lvl: <7 mg/dL — ABNORMAL LOW (ref 7.0–30.0)

## 2019-12-06 MED ORDER — SODIUM CHLORIDE 0.9 % IV BOLUS
1000.0000 mL | Freq: Once | INTRAVENOUS | Status: AC
  Administered 2019-12-06: 1000 mL via INTRAVENOUS

## 2019-12-06 NOTE — ED Notes (Signed)
Sister at bedside.
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
## 2019-12-06 NOTE — ED Notes (Addendum)
Pt with sister visualized ambulating towards exit.  Pt asked to stop, this RN ensured both PIV's had been removed prior to exiting.  Pt ambulatory with steady gait, on room air. NAD. Pt not interested in waiting on discharge paperwork, states "If im still sick, ill be back."

## 2019-12-06 NOTE — ED Triage Notes (Signed)
Pt BIBA found laying supine, extremities extended, in the middle of the street.   EMS reports pt was diaphoretic, labored breathing, pupils equal 3 mm unrreactive. EMS gave 5 mg versed IM (@1543 ), pt now responding to verbal stimuli. Pt screaming, with aggressive posturing immediately prior to versed administration.   HR 174- post versed- 156 BP 142/90

## 2019-12-07 ENCOUNTER — Encounter (HOSPITAL_BASED_OUTPATIENT_CLINIC_OR_DEPARTMENT_OTHER): Payer: Self-pay | Admitting: *Deleted

## 2019-12-07 ENCOUNTER — Emergency Department (HOSPITAL_BASED_OUTPATIENT_CLINIC_OR_DEPARTMENT_OTHER)
Admission: EM | Admit: 2019-12-07 | Discharge: 2019-12-07 | Disposition: A | Discharge status: Home / Self Care | Payer: Self-pay | Attending: Emergency Medicine | Admitting: Emergency Medicine

## 2019-12-07 ENCOUNTER — Other Ambulatory Visit: Payer: Self-pay

## 2019-12-07 DIAGNOSIS — A749 Chlamydial infection, unspecified: Secondary | ICD-10-CM | POA: Insufficient documentation

## 2019-12-07 DIAGNOSIS — F17210 Nicotine dependence, cigarettes, uncomplicated: Secondary | ICD-10-CM | POA: Insufficient documentation

## 2019-12-07 DIAGNOSIS — A64 Unspecified sexually transmitted disease: Secondary | ICD-10-CM

## 2019-12-07 LAB — URINALYSIS, MICROSCOPIC (REFLEX)

## 2019-12-07 LAB — URINALYSIS, ROUTINE W REFLEX MICROSCOPIC
Bilirubin Urine: NEGATIVE
Glucose, UA: NEGATIVE mg/dL
Hgb urine dipstick: NEGATIVE
Ketones, ur: 15 mg/dL — AB
Leukocytes,Ua: NEGATIVE
Nitrite: NEGATIVE
Protein, ur: 30 mg/dL — AB
Specific Gravity, Urine: >1.03 — ABNORMAL HIGH (ref 1.005–1.030)
pH: 6 (ref 5.0–8.0)

## 2019-12-07 LAB — HIV ANTIBODY (ROUTINE TESTING W REFLEX): HIV Screen 4th Generation wRfx: NONREACTIVE

## 2019-12-07 MED ORDER — METRONIDAZOLE 500 MG PO TABS
2000.0000 mg | ORAL_TABLET | Freq: Once | ORAL | Status: AC
  Administered 2019-12-07: 2000 mg via ORAL
  Filled 2019-12-07: qty 4

## 2019-12-07 MED ORDER — CEFTRIAXONE SODIUM 500 MG IJ SOLR
500.0000 mg | Freq: Once | INTRAMUSCULAR | Status: AC
  Administered 2019-12-07: 13:00:00 500 mg via INTRAMUSCULAR
  Filled 2019-12-07: qty 500

## 2019-12-07 MED ORDER — AZITHROMYCIN 250 MG PO TABS
1000.0000 mg | ORAL_TABLET | Freq: Once | ORAL | Status: AC
  Administered 2019-12-07: 1000 mg via ORAL
  Filled 2019-12-07: qty 4

## 2019-12-07 MED ORDER — LIDOCAINE HCL (PF) 1 % IJ SOLN
INTRAMUSCULAR | Status: AC
  Administered 2019-12-07: 13:00:00 2 mL
  Filled 2019-12-07: qty 5

## 2019-12-07 NOTE — ED Notes (Addendum)
States exposed to [place]  States having some penile dc
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
## 2019-12-07 NOTE — ED Triage Notes (Addendum)
Girlfriend was + for Trichomonas yesterday.  Has headache and slight coughing.

## 2019-12-08 LAB — URINE CULTURE: Culture: NO GROWTH

## 2019-12-08 LAB — GC/CHLAMYDIA PROBE AMP (~~LOC~~) NOT AT ARMC
Chlamydia: POSITIVE — AB
Neisseria Gonorrhea: NEGATIVE

## 2021-02-10 ENCOUNTER — Other Ambulatory Visit: Payer: Self-pay

## 2021-02-10 ENCOUNTER — Ambulatory Visit (HOSPITAL_COMMUNITY)
Admission: EM | Admit: 2021-02-10 | Discharge: 2021-02-10 | Disposition: A | Discharge status: Home / Self Care | Payer: Self-pay | Attending: Medical Oncology | Admitting: Medical Oncology

## 2021-02-10 ENCOUNTER — Encounter (HOSPITAL_COMMUNITY): Payer: Self-pay | Admitting: Emergency Medicine

## 2021-02-10 DIAGNOSIS — R197 Diarrhea, unspecified: Secondary | ICD-10-CM

## 2021-02-10 DIAGNOSIS — F17210 Nicotine dependence, cigarettes, uncomplicated: Secondary | ICD-10-CM | POA: Insufficient documentation

## 2021-02-10 DIAGNOSIS — R112 Nausea with vomiting, unspecified: Secondary | ICD-10-CM

## 2021-02-10 DIAGNOSIS — U071 COVID-19: Secondary | ICD-10-CM | POA: Insufficient documentation

## 2021-02-10 DIAGNOSIS — J069 Acute upper respiratory infection, unspecified: Secondary | ICD-10-CM

## 2021-02-10 DIAGNOSIS — R21 Rash and other nonspecific skin eruption: Secondary | ICD-10-CM | POA: Insufficient documentation

## 2021-02-10 MED ORDER — FLUTICASONE PROPIONATE 50 MCG/ACT NA SUSP: 2.0000 | Freq: Every day | NASAL | 0 refills | Status: DC

## 2021-02-10 MED ORDER — BENZONATATE 100 MG PO CAPS: 100.0000 mg | ORAL_CAPSULE | Freq: Three times a day (TID) | ORAL | 0 refills | Status: DC

## 2021-02-10 MED ORDER — TRIAMCINOLONE ACETONIDE 0.1 % EX CREA: 1.0000 "application " | TOPICAL_CREAM | Freq: Two times a day (BID) | CUTANEOUS | 0 refills | Status: DC

## 2021-02-10 MED ORDER — ONDANSETRON 4 MG PO TBDP: 4.0000 mg | ORAL_TABLET | Freq: Three times a day (TID) | ORAL | 0 refills | Status: DC | PRN

## 2021-02-10 NOTE — ED Triage Notes (Signed)
Pt presents today with n/v/d, headache and rash to bilateral upper and lower extremities. +chills
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
## 2021-02-11 LAB — SARS CORONAVIRUS 2 (TAT 6-24 HRS): SARS Coronavirus 2: POSITIVE — AB

## 2021-02-12 ENCOUNTER — Telehealth: Payer: Self-pay | Admitting: Nurse Practitioner

## 2021-02-12 NOTE — Telephone Encounter (Signed)
Called to discuss with patient about COVID-19 symptoms and the use of one of the available treatments for those with mild to moderate Covid symptoms and at a high risk of hospitalization.  Pt appears to qualify for outpatient treatment due to co-morbid conditions and/or a member of an at-risk group in accordance with the FDA Emergency Use Authorization.    States he is doing ok. Treatment declined.   Willette Alma, NP COVID Treatment Team [phone]

## 2021-07-26 ENCOUNTER — Emergency Department (HOSPITAL_COMMUNITY)
Admission: EM | Admit: 2021-07-26 | Discharge: 2021-07-26 | Disposition: A | Discharge status: Home / Self Care | Payer: Self-pay | Attending: Emergency Medicine | Admitting: Emergency Medicine

## 2021-07-26 ENCOUNTER — Encounter (HOSPITAL_COMMUNITY): Payer: Self-pay | Admitting: Emergency Medicine

## 2021-07-26 DIAGNOSIS — R6884 Jaw pain: Secondary | ICD-10-CM | POA: Insufficient documentation

## 2021-07-26 DIAGNOSIS — Z5321 Procedure and treatment not carried out due to patient leaving prior to being seen by health care provider: Secondary | ICD-10-CM | POA: Insufficient documentation

## 2021-07-26 DIAGNOSIS — R519 Headache, unspecified: Secondary | ICD-10-CM | POA: Insufficient documentation
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
## 2022-03-09 IMAGING — CT CT HEAD W/O CM
3 series · 16 of 46 positions shown, 19 images · non-contrast
Comparison: 06/13/2019

CLINICAL DATA: Altered mental status

EXAM:
CT HEAD WITHOUT CONTRAST
TECHNIQUE: Contiguous axial images were obtained from the base of the skull
through the vertex without intravenous contrast.

[Series 2: head wo · axial · 0.47mm/px · z∈[-112,+8]mm · 10 of 29 slices shown, 13 images]
[im 3/29  brain]
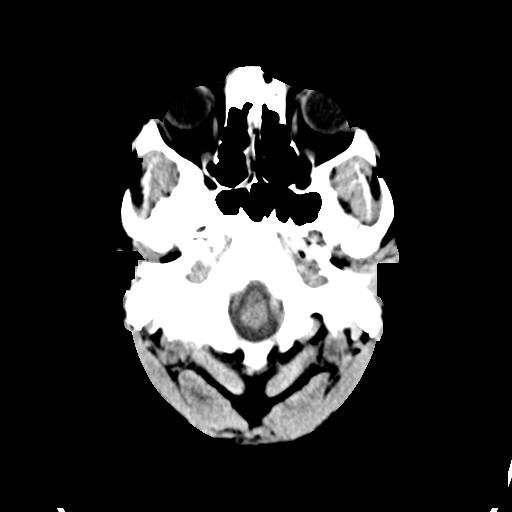
[im 3/29  bone]
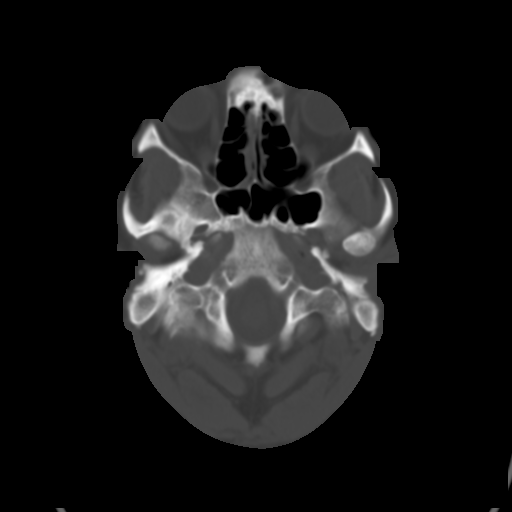
[im 6/29  brain]
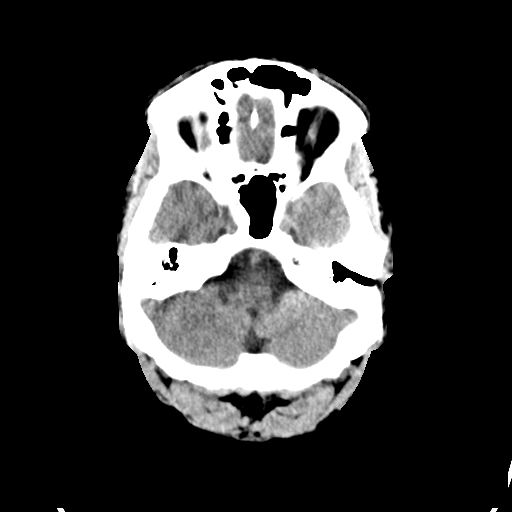
[im 8/29  brain]
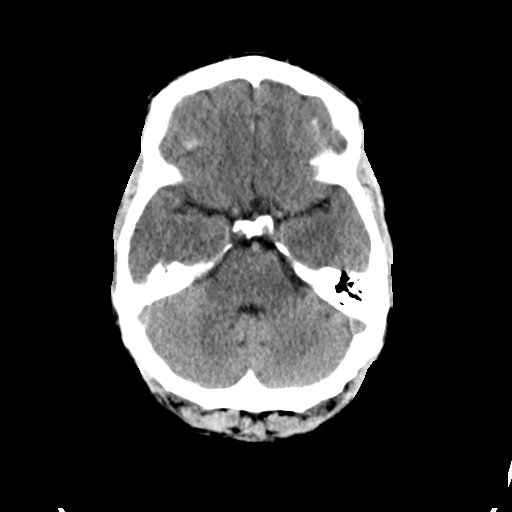
[im 11/29  brain]
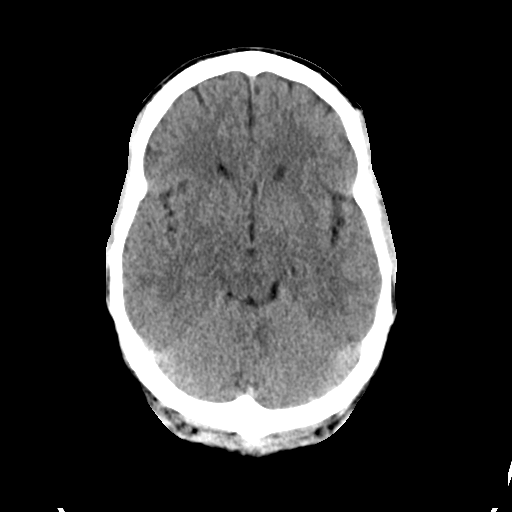
[im 14/29  brain]
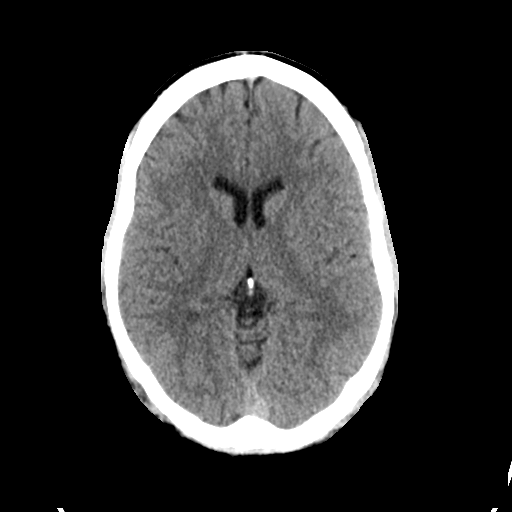
[im 14/29  bone]
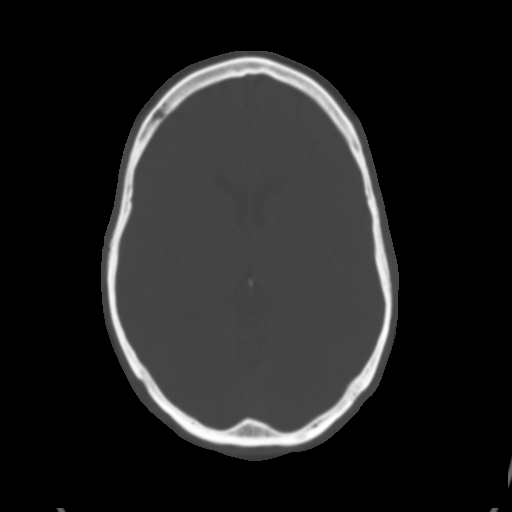
[im 16/29  brain]
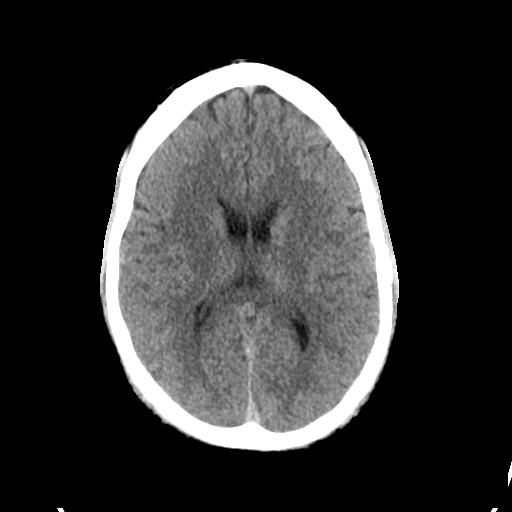
[im 19/29  brain]
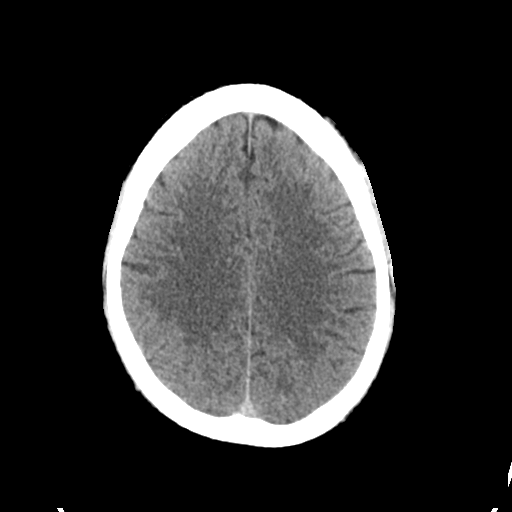
[im 22/29  brain]
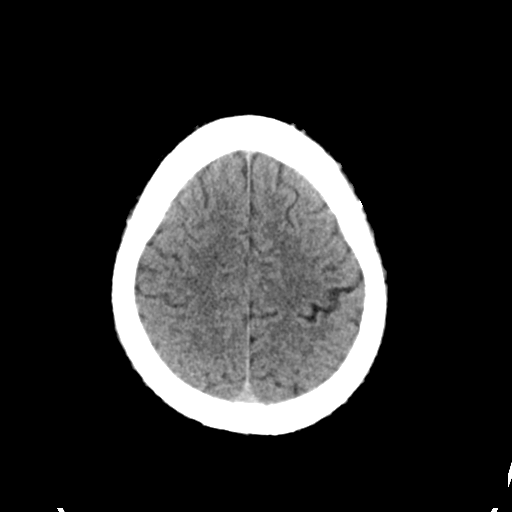
[im 24/29  brain]
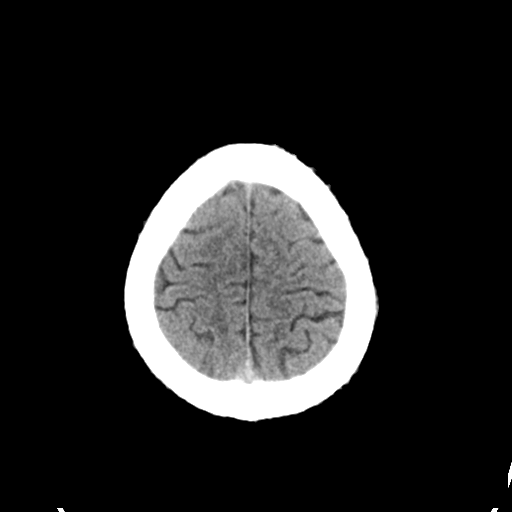
[im 24/29  bone]
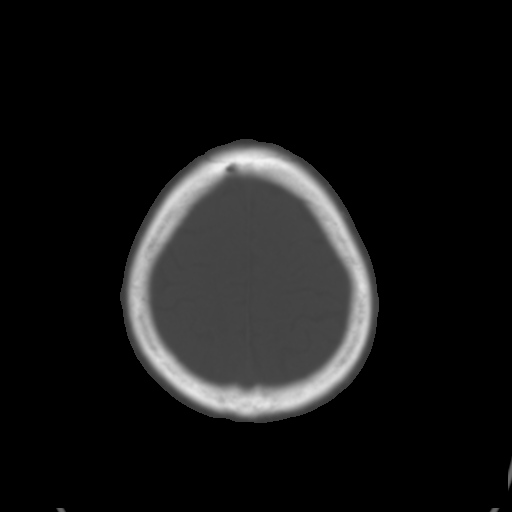
[im 27/29  brain]
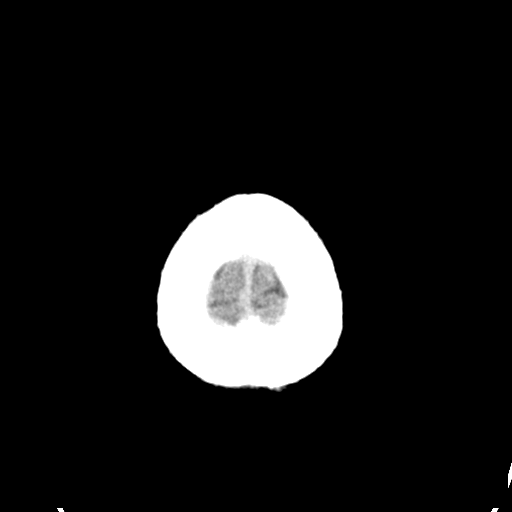

[Series 4: coronal soft tissue · coronal · 0.29mm/px · 3 of 67 slices shown]
[im 23/67  brain]
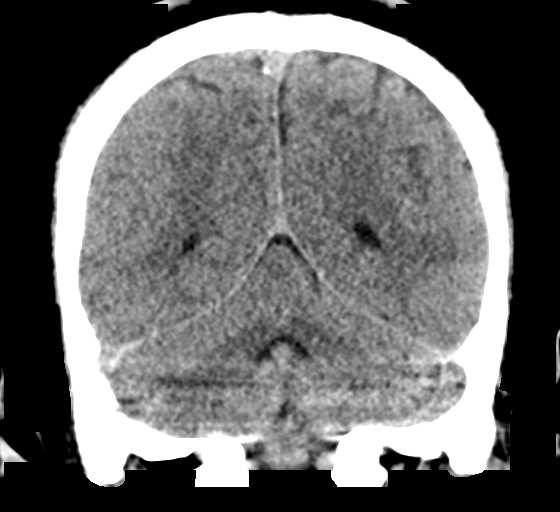
[im 30/67  brain]
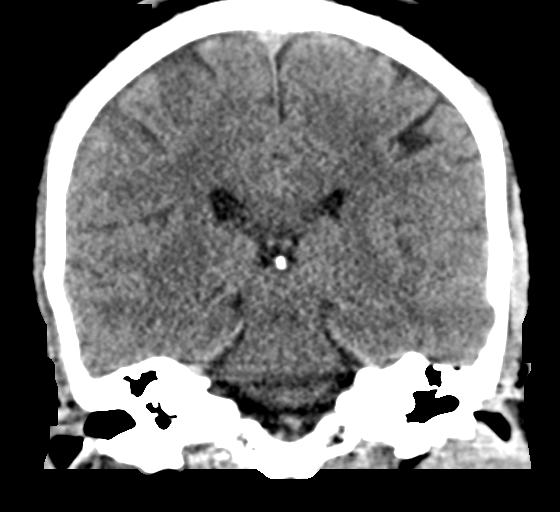
[im 37/67  brain]
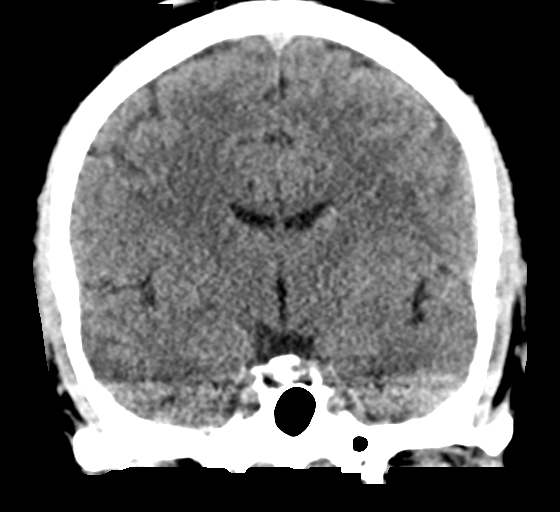

[Series 5: sagittal soft tissue · sagittal · 0.29mm/px · 3 of 55 slices shown]
[im 19/55  brain]
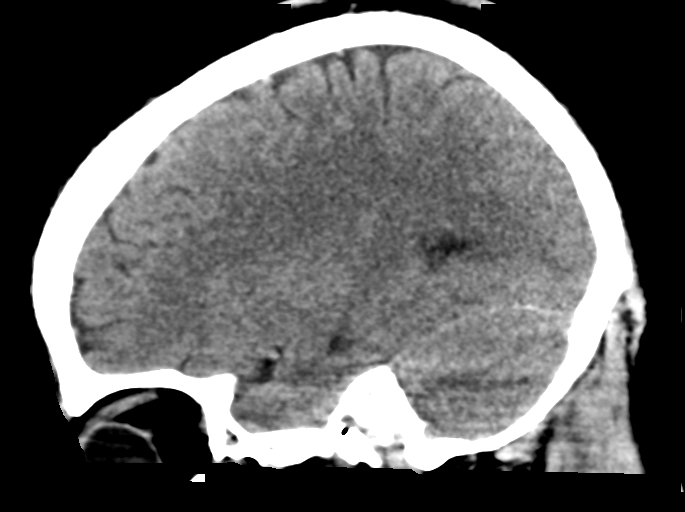
[im 28/55  brain]
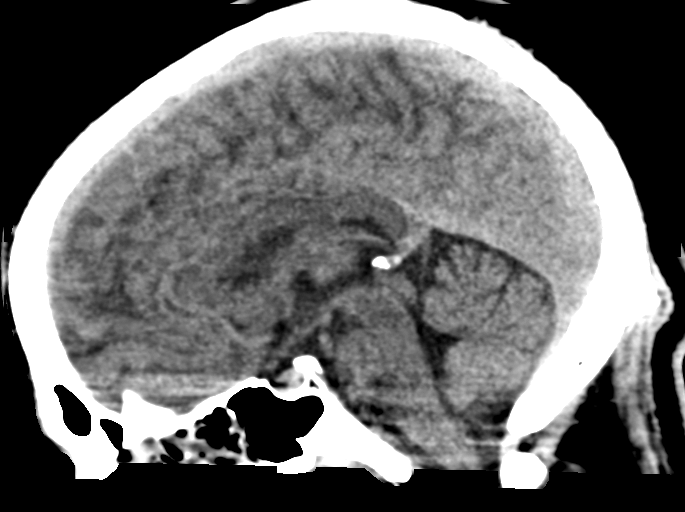
[im 37/55  brain]
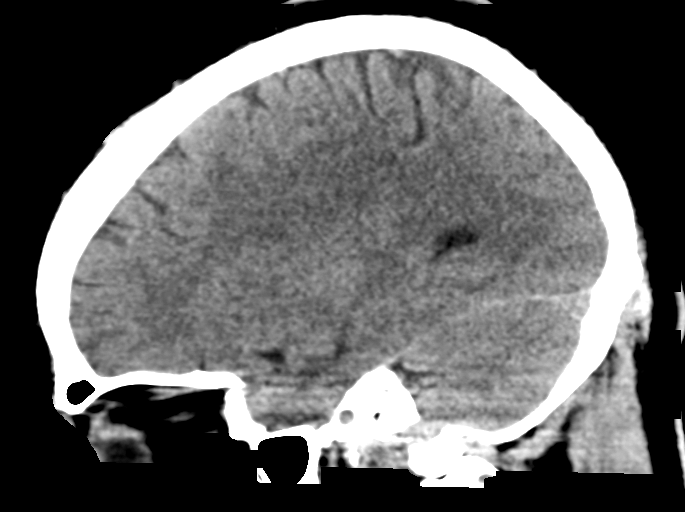

[16 of 46 positions shown; findings below may reference images not displayed]

FINDINGS: Brain: No evidence of acute infarction, hemorrhage, hydrocephalus,
extra-axial collection or mass lesion/mass effect.

Vascular: No hyperdense vessel or unexpected calcification.

Skull: Normal. Negative for fracture or focal lesion.

Sinuses/Orbits: No acute finding.

Other: None.
IMPRESSION: No acute intracranial pathology.

## 2022-04-29 ENCOUNTER — Encounter (HOSPITAL_COMMUNITY): Payer: Self-pay | Admitting: Emergency Medicine

## 2022-04-29 ENCOUNTER — Other Ambulatory Visit: Payer: Self-pay

## 2022-04-29 ENCOUNTER — Emergency Department (HOSPITAL_COMMUNITY)
Admission: EM | Admit: 2022-04-29 | Discharge: 2022-04-29 | Disposition: A | Discharge status: Home / Self Care | Payer: Self-pay | Attending: Emergency Medicine | Admitting: Emergency Medicine

## 2022-04-29 DIAGNOSIS — Z202 Contact with and (suspected) exposure to infections with a predominantly sexual mode of transmission: Secondary | ICD-10-CM | POA: Insufficient documentation

## 2022-04-29 DIAGNOSIS — L723 Sebaceous cyst: Secondary | ICD-10-CM | POA: Insufficient documentation

## 2022-04-29 DIAGNOSIS — L089 Local infection of the skin and subcutaneous tissue, unspecified: Secondary | ICD-10-CM

## 2022-04-29 LAB — HIV ANTIBODY (ROUTINE TESTING W REFLEX): HIV Screen 4th Generation wRfx: NONREACTIVE

## 2022-04-29 LAB — URINALYSIS, ROUTINE W REFLEX MICROSCOPIC
Bilirubin Urine: NEGATIVE
Glucose, UA: NEGATIVE mg/dL
Hgb urine dipstick: NEGATIVE
Ketones, ur: NEGATIVE mg/dL
Leukocytes,Ua: NEGATIVE
Nitrite: NEGATIVE
Protein, ur: NEGATIVE mg/dL
Specific Gravity, Urine: 1.025 (ref 1.005–1.030)
pH: 5 (ref 5.0–8.0)

## 2022-04-29 MED ORDER — LIDOCAINE-EPINEPHRINE (PF) 2 %-1:200000 IJ SOLN
20.0000 mL | Freq: Once | INTRAMUSCULAR | Status: AC
  Administered 2022-04-29: 20 mL via INTRADERMAL
  Filled 2022-04-29: qty 20

## 2022-04-29 NOTE — Discharge Instructions (Addendum)
eneral instructions Rest the affected area. Do not take baths, swim, or use a hot tub until your health care provider approves. Ask your health care provider if you may take showers. You may only be allowed to take sponge baths. Return to your normal activities as told by your health care provider. Ask your health care provider what activities are safe for you. Your health care provider may put you on activity or lifting restrictions. The incision will continue to drain. It is normal to have some clear or slightly bloody drainage. The amount of drainage should lessen each day. Do not apply any creams, ointments, or liquids unless you have been told to by your health care provider. Keep all follow-up visits as told by your health care provider. This is important. Contact a health care provider if: Your cyst or abscess returns. You have more redness, swelling, or pain around your incision. You have more fluid or blood coming from your incision. Your incision feels warm to the touch. You have pus or a bad smell coming from your incision. You have red streaks above or below the incision site. Get help right away if: You have severe pain or bleeding. You cannot eat or drink without vomiting. You have a fever or chills. You have redness that spreads quickly. You have decreased urine output. You become short of breath. You have chest pain. You cough up blood. The affected area becomes numb or starts to tingle.  Free HIV and STD Testing These locations offer FREE confidential testing for HIV, Chlamydia, Gonorrhea, and Syphilis. Non-Traditional Testing Sites Address Telephone  [facility] [address], [place] [phone] Mondays 5pm - 7pm  [facility] [address], [address] [place] [phone] Wednesdays 2pm-8pm  [employer] [facility] [address], [place] [phone] Thursdays 9am-12noon 1pm-4pm  [facility] [address], [place] [phone] Tuesdays Thursdays 9am-12noon 1pm-4pm  [facility] Department of [employer] offers free, confidential testing and treatment for HIV, Chlamydia, Gonorrhea, Syphilis, Herpes, Bacterial Vaginosis, Yeast, and Trichomoniasis. Traditional Testing   [facility]-[place] [facility] [address], [place] [phone]  Monday thru Friday  Call for an appointment  [facility] Department- [facility] [address] [place] [phone] Monday thru Friday  Call for anappointment.  If you have any questions about this information please call [phone]. 08/02/2011

## 2022-04-29 NOTE — ED Provider Notes (Signed)
[name] EMERGENCY DEPARTMENT Provider Note   CSN: [id number] Arrival date & time: 04/29/22  0554     History  Chief Complaint  Patient presents with   Exposure to STD    Antonio Andersen is a 41 y.o. male.  Patient here with recurrent boil in the left groin region. States it is recurrent  and "stinks." Not actively draining but has in the past Patient also thinks he might have an std because he "just has a feeling" because he is "messing with the girls" and he "doesn't trust them. Denies sxs of sti.   The history is provided by the patient.  Abscess Location:  Pelvis Pelvic abscess location:  Groin and perineum Abscess quality: painful   Abscess quality comment:  Foul odor Progression:  Worsening Pain details:    Severity:  Moderate   Duration:  1 month   Timing:  Constant   Progression:  Worsening Chronicity:  Recurrent Relieved by:  None tried Ineffective treatments:  None tried Associated symptoms: no anorexia and no fever   Risk factors: prior abscess        Home Medications Prior to Admission medications   Medication Sig Start Date End Date Taking? Authorizing Provider  benzonatate (TESSALON) 100 MG capsule Take 1 capsule (100 mg total) by mouth every 8 (eight) hours. 02/10/21   Rushie Chestnut, PA-C  fluticasone (FLONASE) 50 MCG/ACT nasal spray Place 2 sprays into both nostrils daily. 02/10/21   Rushie Chestnut, PA-C  ondansetron (ZOFRAN ODT) 4 MG disintegrating tablet Take 1 tablet (4 mg total) by mouth every 8 (eight) hours as needed for nausea or vomiting. 02/10/21   Rushie Chestnut, PA-C  triamcinolone cream (KENALOG) 0.1 % Apply 1 application topically 2 (two) times daily. 02/10/21   Rushie Chestnut, PA-C      Allergies    Patient has no known allergies.    Review of Systems   Review of Systems  Constitutional:  Negative for fever.  Gastrointestinal:  Negative for anorexia.    Physical Exam Updated Vital Signs BP  (!) 167/95 (BP Location: Right Arm)   Pulse 81   Temp 98.1 F (36.7 C) (Oral)   Resp 17   SpO2 98%  Physical Exam Vitals and nursing note reviewed.  Constitutional:      General: He is not in acute distress.    Appearance: He is well-developed. He is not diaphoretic.  HENT:     Head: Normocephalic and atraumatic.  Eyes:     General: No scleral icterus.    Conjunctiva/sclera: Conjunctivae normal.  Cardiovascular:     Rate and Rhythm: Normal rate and regular rhythm.     Heart sounds: Normal heart sounds.  Pulmonary:     Effort: Pulmonary effort is normal. No respiratory distress.     Breath sounds: Normal breath sounds.  Abdominal:     Palpations: Abdomen is soft.     Tenderness: There is no abdominal tenderness.  Genitourinary:    Penis: Normal and circumcised. No discharge or lesions.      Testes: Normal.     Comments: Exam chaperoned by Dalphine Handing Left groin with 1.5 cm fluctuant, tender area, no induration or surrounding cellulitis. Musculoskeletal:     Cervical back: Normal range of motion and neck supple.  Skin:    General: Skin is warm and dry.  Neurological:     Mental Status: He is alert.  Psychiatric:        Behavior: Behavior  normal.     ED Results / Procedures / Treatments   Labs (all labs ordered are listed, but only abnormal results are displayed) Labs Reviewed  URINALYSIS, ROUTINE W REFLEX MICROSCOPIC  RPR  HIV ANTIBODY (ROUTINE TESTING W REFLEX)  GC/CHLAMYDIA PROBE AMP ([place]) NOT AT [facility]    EKG None  Radiology No results found.  Procedures .Marland KitchenIncision and Drainage  Date/Time: 04/29/2022 7:41 AM  Performed by: Arthor Captain, PA-C Authorized by: Arthor Captain, PA-C   Consent:    Consent obtained:  Verbal   Consent given by:  Patient   Risks discussed:  Bleeding, incomplete drainage, pain and damage to other organs   Alternatives discussed:  No treatment Universal protocol:    Procedure explained and questions answered to  patient or proxy's satisfaction: yes     Relevant documents present and verified: yes     Test results available : yes     Imaging studies available: yes     Required blood products, implants, devices, and special equipment available: yes     Site/side marked: yes     Immediately prior to procedure, a time out was called: yes     Patient identity confirmed:  Verbally with patient Location:    Type:  Abscess Pre-procedure details:    Skin preparation:  Betadine Anesthesia:    Anesthesia method:  Local infiltration   Local anesthetic:  Lidocaine 2% WITH epi Procedure type:    Complexity:  Simple Procedure details:    Incision types:  Single straight   Incision depth:  Subcutaneous   Wound management:  Probed and deloculated, irrigated with saline and extensive cleaning   Drainage:  Purulent and bloody   Drainage amount:  Scant   Packing materials:  None Post-procedure details:    Procedure completion:  Tolerated well, no immediate complications     Medications Ordered in ED Medications  lidocaine-EPINEPHrine (XYLOCAINE W/EPI) 2 %-1:100000 (with pres) injection 20 mL (has no administration in time range)    ED Course/ Medical Decision Making/ A&P                           Medical Decision Making Antonio Andersen presents with abscess. There is no area of retained pus after procedure. The presentation of Antonio Andersen is NOT consistent with necrotizing fascitis or osteomyolitis. There is no evidence of retained foreign body, neurovascular or tendon injury. The presentation of Antonio Andersen is NOT consistent with sepsis and/or bacteremia. Appears to have an infected sebaceous cyst. Refer to surgery for definitive excision.  Strict return and follow-up precautions have been given by me personally or by detailed written instructions verbalized by nursing staff using the teach back method to the patient/family/caregiver(s).  Data Reviewed/Counseling: I have reviewed the  patient's vital signs, nursing notes, and other relevant tests/information. I had a detailed discussion regarding the historical points, exam findings, and any diagnostic results supporting the discharge diagnosis. I also discussed the need for outpatient follow-up and the need to return to the ED if symptoms worsen or if there are any questions or concerns that arise at home.    Amount and/or Complexity of Data Reviewed Labs: ordered.    Details: UA clear.  Risk Prescription drug management.            Final Clinical Impression(s) / ED Diagnoses Final diagnoses:  Infected sebaceous cyst of skin    Rx / DC Orders ED Discharge Orders  None         Arthor Captain, PA-C 04/29/22 0746    Melene Plan, DO 04/29/22 1216

## 2022-04-29 NOTE — ED Triage Notes (Signed)
Pt reports he has a boil "between my legs for a few months and I think I have a STD."  Reports "it hurts and stinks."  Denies any discharge.

## 2022-04-30 LAB — RPR: RPR Ser Ql: NONREACTIVE

## 2022-07-26 ENCOUNTER — Ambulatory Visit (HOSPITAL_COMMUNITY)
Admission: EM | Admit: 2022-07-26 | Discharge: 2022-07-26 | Disposition: A | Discharge status: Home / Self Care | Payer: Self-pay | Attending: Physician Assistant | Admitting: Physician Assistant

## 2022-07-26 ENCOUNTER — Encounter (HOSPITAL_COMMUNITY): Payer: Self-pay

## 2022-07-26 DIAGNOSIS — R220 Localized swelling, mass and lump, head: Secondary | ICD-10-CM

## 2022-07-26 MED ORDER — DICLOFENAC SODIUM 75 MG PO TBEC: 75.0000 mg | DELAYED_RELEASE_TABLET | Freq: Two times a day (BID) | ORAL | 0 refills | Status: DC
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
## 2022-07-26 NOTE — Discharge Instructions (Addendum)
Warm compresses to area of pain.  Schedule dental evaluation
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
## 2023-02-12 ENCOUNTER — Ambulatory Visit (HOSPITAL_COMMUNITY)
Admission: EM | Admit: 2023-02-12 | Discharge: 2023-02-12 | Disposition: A | Discharge status: Home / Self Care | Payer: Self-pay | Attending: Family Medicine | Admitting: Family Medicine

## 2023-02-12 ENCOUNTER — Encounter (HOSPITAL_COMMUNITY): Payer: Self-pay

## 2023-02-12 DIAGNOSIS — K047 Periapical abscess without sinus: Secondary | ICD-10-CM

## 2023-02-12 MED ORDER — AMOXICILLIN-POT CLAVULANATE 875-125 MG PO TABS: 1.0000 | ORAL_TABLET | Freq: Two times a day (BID) | ORAL | 0 refills | Status: AC
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
## 2023-02-12 NOTE — Discharge Instructions (Signed)
Take amoxicillin-clavulanate 875 mg--1 tab twice daily with food for 7 days  

## 2023-02-19 ENCOUNTER — Emergency Department (HOSPITAL_COMMUNITY)
Admission: EM | Admit: 2023-02-19 | Discharge: 2023-02-20 | Disposition: A | Discharge status: Home / Self Care | Payer: Self-pay | Attending: Emergency Medicine | Admitting: Emergency Medicine

## 2023-02-19 ENCOUNTER — Other Ambulatory Visit: Payer: Self-pay

## 2023-02-19 DIAGNOSIS — S81852A Open bite, left lower leg, initial encounter: Secondary | ICD-10-CM | POA: Insufficient documentation

## 2023-02-19 DIAGNOSIS — S81851A Open bite, right lower leg, initial encounter: Secondary | ICD-10-CM | POA: Insufficient documentation

## 2023-02-19 DIAGNOSIS — W540XXA Bitten by dog, initial encounter: Secondary | ICD-10-CM | POA: Insufficient documentation

## 2023-02-19 NOTE — ED Triage Notes (Signed)
Patient reports multiple stray dog bites at both legs sustained last night .

## 2023-02-20 MED ORDER — AMOXICILLIN-POT CLAVULANATE 875-125 MG PO TABS: 1.0000 | ORAL_TABLET | Freq: Two times a day (BID) | ORAL | 0 refills | Status: DC

## 2023-02-20 MED ORDER — AMOXICILLIN-POT CLAVULANATE 875-125 MG PO TABS
1.0000 | ORAL_TABLET | Freq: Once | ORAL | Status: AC
  Administered 2023-02-20: 1 via ORAL
  Filled 2023-02-20: qty 1

## 2023-02-20 NOTE — ED Provider Notes (Addendum)
[facility] Provider Note MRN:  [id number]  Arrival date & time: 02/20/23     Chief Complaint   Dog Bite   History of Present Illness   Antonio Andersen is a 42 y.o. year-old male presents to the ED with chief complaint of dog bite.  Patient states that he was "jumped by several dogs" about 24 hours ago.  He states that the dogs are house pets.  He believes they are currently on their rabies shots, but he states he will verify with the owner.  He states that he is current on his tetanus shot.  He states that he just wants a note for work so that he can recover.  He reports mild pain.  History provided by patient.   Review of Systems  Pertinent positive and negative review of systems noted in HPI.    Physical Exam   Vitals:   02/19/23 2151 02/20/23 0209  BP: (!) 156/95 (!) 144/86  Pulse: 96 72  Resp: 17 18  Temp: 98.6 F (37 C) 97.8 F (36.6 C)  SpO2: 99% 100%    CONSTITUTIONAL:  well-appearing, NAD NEURO:  Alert and oriented x 3, CN 3-12 grossly intact EYES:  eyes equal and reactive ENT/NECK:  Supple, no stridor  CARDIO:  appears well-perfused  PULM:  No respiratory distress,  GI/GU:  non-distended,  MSK/SPINE:  No gross deformities, no edema, moves all extremities  SKIN:  no rash, dog bites as pictured       *Additional and/or pertinent findings included in MDM below  Diagnostic and Interventional Summary    EKG Interpretation  Date/Time:    Ventricular Rate:    PR Interval:    QRS Duration:   QT Interval:    QTC Calculation:   R Axis:     Text Interpretation:         Labs Reviewed - No data to display  No orders to display    Medications  amoxicillin-clavulanate (AUGMENTIN) 875-125 MG per tablet 1 tablet (1 tablet Oral Given 02/20/23 0231)     Procedures  /  Critical Care Procedures  ED Course and Medical Decision Making  I have reviewed the triage vital signs, the nursing notes, and pertinent  available records from the EMR.  Social Determinants Affecting Complexity of Care: Patient has no clinically significant social determinants affecting this chief complaint..   ED Course:    Medical Decision Making Patient here with dog bites to his legs.  He was bitten almost 24 hours ago.  He states he is up-to-date on his tetanus shot.  He believes the dogs have had the rabies shots, but states that he will find out from the owner today.  I have encouraged him to contact animal control if he anticipates any difficulty in obtaining verification of the rabies vaccine.  We discussed that he should return if the dogs or not current, but given that their house pets, feel that the risk of rabies is extremely low.  The patient's wounds will need to heal with secondary intention due to risk of infection and since they have been open for so long.  Patient was treated with Augmentin in the ED with a prescription for the same.  Risk Prescription drug management.     Consultants: No consultations were needed in caring for this patient.   Treatment and Plan: Emergency department workup does not suggest an emergent condition requiring admission or immediate intervention beyond  what has been performed at  this time. The patient is safe for discharge and has  been instructed to return immediately for worsening symptoms, change in  symptoms or any other concerns    Final Clinical Impressions(s) / ED Diagnoses     ICD-10-CM   1. Dog bite, initial encounter  [email or web address]       ED Discharge Orders          Ordered    amoxicillin-clavulanate (AUGMENTIN) 875-125 MG tablet  Every 12 hours        02/20/23 0235              Discharge Instructions Discussed with and Provided to Patient:     Discharge Instructions      Tomorrow, you need to verify with the owner that the dogs have had their rabies shots.  If you would like, you can call animal control to have them do this for you.   If the  dogs have not had their shots, you need to return to the ER for the rabies vaccine series.         Roxy Horseman, PA-C 02/20/23 0236    Roxy Horseman, PA-C 02/20/23 1610    Gilda Crease, MD 02/20/23 [phone]

## 2023-02-20 NOTE — Discharge Instructions (Addendum)
Tomorrow, you need to verify with the owner that the dogs have had their rabies shots.  If you would like, you can call animal control to have them do this for you.   If the dogs have not had their shots, you need to return to the ER for the rabies vaccine series.

## 2023-08-05 ENCOUNTER — Emergency Department (HOSPITAL_COMMUNITY)
Admission: EM | Admit: 2023-08-05 | Discharge: 2023-08-05 | Disposition: A | Discharge status: Home / Self Care | Payer: Self-pay | Attending: Emergency Medicine | Admitting: Emergency Medicine

## 2023-08-05 ENCOUNTER — Other Ambulatory Visit: Payer: Self-pay

## 2023-08-05 DIAGNOSIS — L02411 Cutaneous abscess of right axilla: Secondary | ICD-10-CM | POA: Insufficient documentation

## 2023-08-05 MED ORDER — DOXYCYCLINE HYCLATE 100 MG PO CAPS: 100.0000 mg | ORAL_CAPSULE | Freq: Two times a day (BID) | ORAL | 0 refills | Status: DC

## 2023-08-05 NOTE — Discharge Instructions (Signed)
Take the antibiotics as prescribed, warm compress to the area, you want this to continue draining  Follow-up outpatient  Return for new or worsening symptoms

## 2023-08-05 NOTE — ED Provider Notes (Signed)
[facility] Provider Note   CSN: [id number] Arrival date & time: 08/05/23  1820    History  Chief Complaint  Patient presents with   Abscess    Antonio Andersen is a 42 y.o. male here for evaluation of abscess to right axilla. Hx of similar. It started spontaneously draining today. No fever, nausea, vomiting, numbness, weakness.  He states he just wants antibiotics and to "go home."  No history of hidradenitis suppurativa.  HPI     Home Medications Prior to Admission medications   Medication Sig Start Date End Date Taking? Authorizing Provider  doxycycline (VIBRAMYCIN) 100 MG capsule Take 1 capsule (100 mg total) by mouth 2 (two) times daily. 08/05/23  Yes Purva Vessell A, PA-C  diclofenac (VOLTAREN) 75 MG EC tablet Take 1 tablet (75 mg total) by mouth 2 (two) times daily. 07/26/22   Elson Areas, PA-C  fluticasone (FLONASE) 50 MCG/ACT nasal spray Place 2 sprays into both nostrils daily. 02/10/21   Rushie Chestnut, PA-C  ondansetron (ZOFRAN ODT) 4 MG disintegrating tablet Take 1 tablet (4 mg total) by mouth every 8 (eight) hours as needed for nausea or vomiting. 02/10/21   Rushie Chestnut, PA-C  triamcinolone cream (KENALOG) 0.1 % Apply 1 application topically 2 (two) times daily. 02/10/21   Rushie Chestnut, PA-C      Allergies    Patient has no known allergies.    Review of Systems   Review of Systems  Constitutional: Negative.   HENT: Negative.    Respiratory: Negative.    Cardiovascular: Negative.   Gastrointestinal: Negative.   Genitourinary: Negative.   Musculoskeletal: Negative.   Skin:  Positive for rash.  Neurological: Negative.   All other systems reviewed and are negative.  Physical Exam Updated Vital Signs BP (!) 130/90   Pulse 90   Temp 98.6 F (37 C) (Oral)   Resp 18   Ht 5\' 9"  (1.753 m)   Wt 78.7 kg   SpO2 99%   BMI 25.62 kg/m  Physical Exam Vitals and nursing note reviewed.   Constitutional:      General: He is not in acute distress.    Appearance: He is well-developed. He is not ill-appearing, toxic-appearing or diaphoretic.  HENT:     Head: Normocephalic and atraumatic.  Eyes:     Pupils: Pupils are equal, round, and reactive to light.  Cardiovascular:     Rate and Rhythm: Normal rate and regular rhythm.     Pulses:          Radial pulses are 2+ on the right side and 2+ on the left side.  Pulmonary:     Effort: Pulmonary effort is normal. No respiratory distress.  Abdominal:     General: There is no distension.     Palpations: Abdomen is soft.  Musculoskeletal:        General: Normal range of motion.     Cervical back: Normal range of motion and neck supple.     Comments: No bony tenderness, full range of motion  Skin:    General: Skin is warm and dry.     Comments: Purulent drainage right axilla, 5 cm area of induration with central 2 cm of fluctuance with spontaneous purulent drainage, no erythema.  Does not extend into chest wall  Neurological:     General: No focal deficit present.     Mental Status: He is alert and oriented to person, place, and time.  ED Results / Procedures / Treatments   Labs (all labs ordered are listed, but only abnormal results are displayed) Labs Reviewed - No data to display  EKG None  Radiology No results found.  Procedures Procedures    Medications Ordered in ED Medications - No data to display  ED Course/ Medical Decision Making/ A&P   42 year old with no past medical history here for evaluation of right axillary abscess.  He has history of similar.  Started spontaneously draining earlier today.  He is afebrile, nonseptic, not ill-appearing.  He has 5 cm areas of induration with central fluctuance with spontaneous purulent drainage from the area.  Does not extend into the chest wall.  He has no systemic complaints.  I do not feel he needs additional incision and drainage given he has spontaneous  drainage.  He does have some chronic skin changes consistent with prior abscesses however he denies any prior history of hidradenitis suppurativa.  Will start on antibiotics, discussed warm compress, return for new or worsening symptoms. I do not think he needs labs or imaging at this time, as I have low suspicion he needs hospitalization, surgical intervention at this time.  The patient has been appropriately medically screened and/or stabilized in the ED. I have low suspicion for any other emergent medical condition which would require further screening, evaluation or treatment in the ED or require inpatient management.  Patient is hemodynamically stable and in no acute distress.  Patient able to ambulate in department prior to ED.  Evaluation does not show acute pathology that would require ongoing or additional emergent interventions while in the emergency department or further inpatient treatment.  I have discussed the diagnosis with the patient and answered all questions.  Pain is been managed while in the emergency department and patient has no further complaints prior to discharge.  Patient is comfortable with plan discussed in room and is stable for discharge at this time.  I have discussed strict return precautions for returning to the emergency department.  Patient was encouraged to follow-up with PCP/specialist refer to at discharge.                                  Medical Decision Making Amount and/or Complexity of Data Reviewed External Data Reviewed: labs, radiology and notes.  Risk OTC drugs. Prescription drug management. Decision regarding hospitalization. Diagnosis or treatment significantly limited by social determinants of health.         Final Clinical Impression(s) / ED Diagnoses Final diagnoses:  Abscess of axilla, right    Rx / DC Orders ED Discharge Orders          Ordered    doxycycline (VIBRAMYCIN) 100 MG capsule  2 times daily        08/05/23 2037               Reynol Arnone A, PA-C 08/05/23 2101    Melene Plan, DO 08/05/23 2236

## 2023-08-05 NOTE — ED Triage Notes (Signed)
Patient reports having a boil under his right arm. States it popped today and he would just like some antibiotics and ibuprofen and a work note.

## 2023-09-25 ENCOUNTER — Other Ambulatory Visit: Payer: Self-pay

## 2023-09-25 ENCOUNTER — Emergency Department (HOSPITAL_COMMUNITY)
Admission: EM | Admit: 2023-09-25 | Discharge: 2023-09-26 | Disposition: A | Discharge status: Home / Self Care | Payer: 59 | Attending: Emergency Medicine | Admitting: Emergency Medicine

## 2023-09-25 ENCOUNTER — Emergency Department (HOSPITAL_COMMUNITY): Payer: Self-pay

## 2023-09-25 ENCOUNTER — Emergency Department (HOSPITAL_COMMUNITY): Payer: 59

## 2023-09-25 ENCOUNTER — Encounter (HOSPITAL_COMMUNITY): Payer: Self-pay

## 2023-09-25 DIAGNOSIS — R4182 Altered mental status, unspecified: Secondary | ICD-10-CM

## 2023-09-25 DIAGNOSIS — Y92410 Unspecified street and highway as the place of occurrence of the external cause: Secondary | ICD-10-CM | POA: Diagnosis not present

## 2023-09-25 DIAGNOSIS — Z23 Encounter for immunization: Secondary | ICD-10-CM | POA: Diagnosis not present

## 2023-09-25 DIAGNOSIS — R400 Somnolence: Secondary | ICD-10-CM | POA: Diagnosis present

## 2023-09-25 LAB — RAPID URINE DRUG SCREEN, HOSP PERFORMED
Amphetamines: NOT DETECTED
Barbiturates: NOT DETECTED
Benzodiazepines: NOT DETECTED
Cocaine: POSITIVE — AB
Opiates: NOT DETECTED
Tetrahydrocannabinol: POSITIVE — AB

## 2023-09-25 LAB — COMPREHENSIVE METABOLIC PANEL
ALT: 15 U/L (ref 0–44)
AST: 22 U/L (ref 15–41)
Albumin: 3.7 g/dL (ref 3.5–5.0)
Alkaline Phosphatase: 92 U/L (ref 38–126)
Anion gap: 9 (ref 5–15)
BUN: 10 mg/dL (ref 6–20)
CO2: 25 mmol/L (ref 22–32)
Calcium: 9.1 mg/dL (ref 8.9–10.3)
Chloride: 105 mmol/L (ref 98–111)
Creatinine, Ser: 0.95 mg/dL (ref 0.61–1.24)
GFR, Estimated: >60 mL/min (ref >60)
Glucose, Bld: 96 mg/dL (ref 70–99)
Potassium: 3.5 mmol/L (ref 3.5–5.1)
Sodium: 139 mmol/L (ref 135–145)
Total Bilirubin: 0.8 mg/dL (ref 0.0–1.2)
Total Protein: 6.9 g/dL (ref 6.5–8.1)

## 2023-09-25 LAB — URINALYSIS, ROUTINE W REFLEX MICROSCOPIC
Bilirubin Urine: NEGATIVE
Glucose, UA: NEGATIVE mg/dL
Hgb urine dipstick: NEGATIVE
Ketones, ur: 5 mg/dL — AB
Leukocytes,Ua: NEGATIVE
Nitrite: NEGATIVE
Protein, ur: NEGATIVE mg/dL
Specific Gravity, Urine: 1.045 — ABNORMAL HIGH (ref 1.005–1.030)
pH: 6 (ref 5.0–8.0)

## 2023-09-25 LAB — CBC
HCT: 38.7 % — ABNORMAL LOW (ref 39.0–52.0)
Hemoglobin: 12.9 g/dL — ABNORMAL LOW (ref 13.0–17.0)
MCH: 28.9 pg (ref 26.0–34.0)
MCHC: 33.3 g/dL (ref 30.0–36.0)
MCV: 86.6 fL (ref 80.0–100.0)
Platelets: 193 10*3/uL (ref 150–400)
RBC: 4.47 MIL/uL (ref 4.22–5.81)
RDW: 13.2 % (ref 11.5–15.5)
WBC: 6.3 10*3/uL (ref 4.0–10.5)
nRBC: 0 % (ref 0.0–0.2)

## 2023-09-25 LAB — I-STAT CHEM 8, ED
BUN: 11 mg/dL (ref 6–20)
Calcium, Ion: 1.14 mmol/L — ABNORMAL LOW (ref 1.15–1.40)
Chloride: 104 mmol/L (ref 98–111)
Creatinine, Ser: 0.9 mg/dL (ref 0.61–1.24)
Glucose, Bld: 91 mg/dL (ref 70–99)
HCT: 39 % (ref 39.0–52.0)
Hemoglobin: 13.3 g/dL (ref 13.0–17.0)
Potassium: 3.5 mmol/L (ref 3.5–5.1)
Sodium: 140 mmol/L (ref 135–145)
TCO2: 25 mmol/L (ref 22–32)

## 2023-09-25 LAB — ETHANOL: Alcohol, Ethyl (B): <10 mg/dL (ref <10)

## 2023-09-25 LAB — SAMPLE TO BLOOD BANK

## 2023-09-25 LAB — PROTIME-INR
INR: 1 (ref 0.8–1.2)
Prothrombin Time: 13.8 s (ref 11.4–15.2)

## 2023-09-25 LAB — CBG MONITORING, ED: Glucose-Capillary: 112 mg/dL — ABNORMAL HIGH (ref 70–99)

## 2023-09-25 LAB — I-STAT CG4 LACTIC ACID, ED: Lactic Acid, Venous: 0.7 mmol/L (ref 0.5–1.9)

## 2023-09-25 MED ORDER — IOHEXOL 350 MG/ML SOLN
75.0000 mL | Freq: Once | INTRAVENOUS | Status: AC | PRN
  Administered 2023-09-25: 75 mL via INTRAVENOUS

## 2023-09-25 MED ORDER — TETANUS-DIPHTH-ACELL PERTUSSIS 5-2.5-18.5 LF-MCG/0.5 IM SUSY
0.5000 mL | PREFILLED_SYRINGE | Freq: Once | INTRAMUSCULAR | Status: AC
  Administered 2023-09-25: 0.5 mL via INTRAMUSCULAR
  Filled 2023-09-25: qty 0.5

## 2023-09-25 MED ORDER — NALOXONE HCL 2 MG/2ML IJ SOSY
2.0000 mg | PREFILLED_SYRINGE | Freq: Once | INTRAMUSCULAR | Status: AC
  Administered 2023-09-25: 2 mg via INTRAVENOUS

## 2023-09-25 NOTE — ED Notes (Signed)
 1st lactic in normal range 0.73, 2nd can be discontinue if Dr allows
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
## 2023-09-25 NOTE — ED Notes (Signed)
 X-ray at bedside
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
## 2023-09-25 NOTE — ED Notes (Addendum)
SHP at bedside.  
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
## 2023-09-25 NOTE — ED Notes (Signed)
 Family at bedside.

## 2023-09-26 ENCOUNTER — Encounter (HOSPITAL_COMMUNITY): Payer: Self-pay

## 2023-09-26 NOTE — Discharge Instructions (Addendum)
 You were evaluated in the Emergency Department and after careful evaluation, we did not find any emergent condition requiring admission or further testing in the hospital.  Your exam/testing today is overall reassuring.  No significant traumatic injuries.  Please return to the Emergency Department if you experience any worsening of your condition.   Thank you for allowing us  to be a part of your care.

## 2023-09-26 NOTE — ED Notes (Signed)
 Pt remains extremely drowsy at this time.

## 2023-12-05 ENCOUNTER — Encounter (HOSPITAL_COMMUNITY): Payer: Self-pay | Admitting: Emergency Medicine

## 2023-12-05 ENCOUNTER — Other Ambulatory Visit: Payer: Self-pay

## 2023-12-05 ENCOUNTER — Emergency Department (HOSPITAL_COMMUNITY)
Admission: EM | Admit: 2023-12-05 | Discharge: 2023-12-05 | Discharge status: Against Medical Advice | Attending: Emergency Medicine | Admitting: Emergency Medicine

## 2023-12-05 DIAGNOSIS — R030 Elevated blood-pressure reading, without diagnosis of hypertension: Secondary | ICD-10-CM | POA: Insufficient documentation

## 2023-12-05 DIAGNOSIS — R4182 Altered mental status, unspecified: Secondary | ICD-10-CM | POA: Insufficient documentation

## 2023-12-05 DIAGNOSIS — R000 Tachycardia, unspecified: Secondary | ICD-10-CM | POA: Diagnosis not present

## 2023-12-05 NOTE — ED Notes (Signed)
 Patient requesting to leave, A&Ox4, provider notified.

## 2023-12-05 NOTE — ED Provider Notes (Signed)
 [place] EMERGENCY DEPARTMENT AT [facility] Provider Note   CSN: [id number] Arrival date & time: 12/05/23  4098     History  Chief Complaint  Patient presents with   Altered Mental Status    Antonio Andersen is a 43 y.o. male.  Brought to the emergency department by EMS for altered mental status.  Roommate reported that he was not responsive.  EMS report strange behavior during transport with elevated blood pressure and heart rate.       Home Medications Prior to Admission medications   Medication Sig Start Date End Date Taking? Authorizing Provider  diclofenac (VOLTAREN) 75 MG EC tablet Take 1 tablet (75 mg total) by mouth 2 (two) times daily. 07/26/22   Elson Areas, PA-C  doxycycline (VIBRAMYCIN) 100 MG capsule Take 1 capsule (100 mg total) by mouth 2 (two) times daily. 08/05/23   Henderly, Britni A, PA-C  fluticasone (FLONASE) 50 MCG/ACT nasal spray Place 2 sprays into both nostrils daily. 02/10/21   Rushie Chestnut, PA-C  ondansetron (ZOFRAN ODT) 4 MG disintegrating tablet Take 1 tablet (4 mg total) by mouth every 8 (eight) hours as needed for nausea or vomiting. 02/10/21   Rushie Chestnut, PA-C  triamcinolone cream (KENALOG) 0.1 % Apply 1 application topically 2 (two) times daily. 02/10/21   Rushie Chestnut, PA-C      Allergies    Patient has no known allergies.    Review of Systems   Review of Systems  Physical Exam Updated Vital Signs BP (!) 163/90   Pulse (!) 121   Temp 98.3 F (36.8 C) (Oral)   Resp 18   Ht 5\' 8"  (1.727 m)   Wt 77.1 kg   SpO2 100%   BMI 25.85 kg/m  Physical Exam Vitals and nursing note reviewed.  Constitutional:      General: He is not in acute distress.    Appearance: He is well-developed.  HENT:     Head: Normocephalic and atraumatic.     Mouth/Throat:     Mouth: Mucous membranes are moist.  Eyes:     General: Vision grossly intact. Gaze aligned appropriately.     Extraocular Movements: Extraocular  movements intact.     Conjunctiva/sclera: Conjunctivae normal.  Cardiovascular:     Rate and Rhythm: Normal rate and regular rhythm.     Pulses: Normal pulses.     Heart sounds: Normal heart sounds, S1 normal and S2 normal. No murmur heard.    No friction rub. No gallop.  Pulmonary:     Effort: Pulmonary effort is normal. No respiratory distress.     Breath sounds: Normal breath sounds.  Abdominal:     Palpations: Abdomen is soft.     Tenderness: There is no abdominal tenderness. There is no guarding or rebound.     Hernia: No hernia is present.  Musculoskeletal:        General: No swelling.     Cervical back: Full passive range of motion without pain, normal range of motion and neck supple. No pain with movement, spinous process tenderness or muscular tenderness. Normal range of motion.     Right lower leg: No edema.     Left lower leg: No edema.  Skin:    General: Skin is warm and dry.     Capillary Refill: Capillary refill takes less than 2 seconds.     Findings: No ecchymosis, erythema, lesion or wound.  Neurological:     Mental Status: He is  alert and oriented to person, place, and time.     GCS: GCS eye subscore is 4. GCS verbal subscore is 5. GCS motor subscore is 6.     Cranial Nerves: Cranial nerves 2-12 are intact.     Sensory: Sensation is intact.     Motor: Motor function is intact. No weakness or abnormal muscle tone.     Coordination: Coordination is intact.  Psychiatric:        Mood and Affect: Mood normal.        Speech: Speech normal.        Behavior: Behavior normal.     ED Results / Procedures / Treatments   Labs (all labs ordered are listed, but only abnormal results are displayed) Labs Reviewed - No data to display  EKG EKG Interpretation Date/Time:  Thursday December 05 2023 03:59:26 EDT Ventricular Rate:  124 PR Interval:  151 QRS Duration:  93 QT Interval:  314 QTC Calculation: 451 R Axis:   77  Text Interpretation: Sinus tachycardia S1,S2,S3  pattern RSR' in V1 or V2, right VCD or RVH Confirmed by Gilda Crease [phone]) on 12/05/2023 4:01:55 AM  Radiology No results found.  Procedures Procedures    Medications Ordered in ED Medications - No data to display  ED Course/ Medical Decision Making/ A&P                                 Medical Decision Making  Encountered patient in the room, awake and alert.  He reports that he feels fine.  He says that his roommate overreacted.  He admits to drinking a couple of shots tonight.  Smells of marijuana.  Blood pressure much improved, still mildly tachycardic.  No fever, no focal symptoms.  Patient not complaining of any chest pain, shortness of breath.  Suspect polysubstance abuse causing his current presentation, will monitor.        Final Clinical Impression(s) / ED Diagnoses Final diagnoses:  Altered mental status, unspecified altered mental status type    Rx / DC Orders ED Discharge Orders     None         Cierrah Dace, Canary Brim, MD 12/05/23 [phone]
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
## 2024-03-09 ENCOUNTER — Emergency Department (HOSPITAL_COMMUNITY): Payer: Self-pay

## 2024-03-09 ENCOUNTER — Emergency Department (HOSPITAL_COMMUNITY)
Admission: EM | Admit: 2024-03-09 | Discharge: 2024-03-09 | Disposition: A | Discharge status: Home / Self Care | Payer: Self-pay | Attending: Emergency Medicine | Admitting: Emergency Medicine

## 2024-03-09 ENCOUNTER — Encounter (HOSPITAL_COMMUNITY): Payer: Self-pay

## 2024-03-09 DIAGNOSIS — R4182 Altered mental status, unspecified: Secondary | ICD-10-CM | POA: Insufficient documentation

## 2024-03-09 DIAGNOSIS — R079 Chest pain, unspecified: Secondary | ICD-10-CM | POA: Diagnosis not present

## 2024-03-09 DIAGNOSIS — W19XXXA Unspecified fall, initial encounter: Secondary | ICD-10-CM | POA: Diagnosis not present

## 2024-03-09 DIAGNOSIS — R000 Tachycardia, unspecified: Secondary | ICD-10-CM | POA: Diagnosis not present

## 2024-03-09 DIAGNOSIS — L03211 Cellulitis of face: Secondary | ICD-10-CM | POA: Diagnosis not present

## 2024-03-09 LAB — CBC WITH DIFFERENTIAL/PLATELET
Abs Immature Granulocytes: 0.09 10*3/uL — ABNORMAL HIGH (ref 0.00–0.07)
Basophils Absolute: 0 10*3/uL (ref 0.0–0.1)
Basophils Relative: 0 %
Eosinophils Absolute: 0 10*3/uL (ref 0.0–0.5)
Eosinophils Relative: 0 %
HCT: 41.6 % (ref 39.0–52.0)
Hemoglobin: 13.7 g/dL (ref 13.0–17.0)
Immature Granulocytes: 1 %
Lymphocytes Relative: 17 %
Lymphs Abs: 2.1 10*3/uL (ref 0.7–4.0)
MCH: 29.2 pg (ref 26.0–34.0)
MCHC: 32.9 g/dL (ref 30.0–36.0)
MCV: 88.7 fL (ref 80.0–100.0)
Monocytes Absolute: 0.7 10*3/uL (ref 0.1–1.0)
Monocytes Relative: 6 %
Neutro Abs: 9 10*3/uL — ABNORMAL HIGH (ref 1.7–7.7)
Neutrophils Relative %: 76 %
Platelets: 223 10*3/uL (ref 150–400)
RBC: 4.69 MIL/uL (ref 4.22–5.81)
RDW: 13.2 % (ref 11.5–15.5)
WBC: 11.8 10*3/uL — ABNORMAL HIGH (ref 4.0–10.5)
nRBC: 0 % (ref 0.0–0.2)

## 2024-03-09 LAB — COMPREHENSIVE METABOLIC PANEL WITH GFR
ALT: 39 U/L (ref 0–44)
AST: 61 U/L — ABNORMAL HIGH (ref 15–41)
Albumin: 3.7 g/dL (ref 3.5–5.0)
Alkaline Phosphatase: 92 U/L (ref 38–126)
Anion gap: 14 (ref 5–15)
BUN: 14 mg/dL (ref 6–20)
CO2: 19 mmol/L — ABNORMAL LOW (ref 22–32)
Calcium: 8.7 mg/dL — ABNORMAL LOW (ref 8.9–10.3)
Chloride: 104 mmol/L (ref 98–111)
Creatinine, Ser: 1.44 mg/dL — ABNORMAL HIGH (ref 0.61–1.24)
GFR, Estimated: >60 mL/min (ref >60)
Glucose, Bld: 192 mg/dL — ABNORMAL HIGH (ref 70–99)
Potassium: 3.3 mmol/L — ABNORMAL LOW (ref 3.5–5.1)
Sodium: 137 mmol/L (ref 135–145)
Total Bilirubin: 0.5 mg/dL (ref 0.0–1.2)
Total Protein: 6.7 g/dL (ref 6.5–8.1)

## 2024-03-09 LAB — I-STAT CHEM 8, ED
BUN: 15 mg/dL (ref 6–20)
Calcium, Ion: 1.06 mmol/L — ABNORMAL LOW (ref 1.15–1.40)
Chloride: 104 mmol/L (ref 98–111)
Creatinine, Ser: 1.3 mg/dL — ABNORMAL HIGH (ref 0.61–1.24)
Glucose, Bld: 189 mg/dL — ABNORMAL HIGH (ref 70–99)
HCT: 42 % (ref 39.0–52.0)
Hemoglobin: 14.3 g/dL (ref 13.0–17.0)
Potassium: 3.3 mmol/L — ABNORMAL LOW (ref 3.5–5.1)
Sodium: 138 mmol/L (ref 135–145)
TCO2: 18 mmol/L — ABNORMAL LOW (ref 22–32)

## 2024-03-09 LAB — ETHANOL: Alcohol, Ethyl (B): <15 mg/dL (ref <15)

## 2024-03-09 MED ORDER — DOXYCYCLINE HYCLATE 100 MG PO CAPS: 100.0000 mg | ORAL_CAPSULE | Freq: Two times a day (BID) | ORAL | 0 refills | Status: AC

## 2024-03-09 MED ORDER — KETOROLAC TROMETHAMINE 15 MG/ML IJ SOLN
15.0000 mg | Freq: Once | INTRAMUSCULAR | Status: AC
  Administered 2024-03-09: 15 mg via INTRAVENOUS
  Filled 2024-03-09: qty 1

## 2024-03-09 MED ORDER — METHOCARBAMOL 500 MG PO TABS: 500.0000 mg | ORAL_TABLET | Freq: Three times a day (TID) | ORAL | 0 refills | Status: DC | PRN

## 2024-03-09 MED ORDER — IOHEXOL 350 MG/ML SOLN
75.0000 mL | Freq: Once | INTRAVENOUS | Status: AC | PRN
  Administered 2024-03-09: 75 mL via INTRAVENOUS

## 2024-03-09 NOTE — ED Notes (Signed)
Provided patient with sandwhich.

## 2024-03-09 NOTE — ED Notes (Signed)
 Patient appears to be waking up. Patient assessed for pain and MD notified.

## 2024-03-09 NOTE — Discharge Instructions (Addendum)
 No clear traumatic injury was seen on the CAT scan.  Although there is some lung findings it could be fluid or infection.  Watch for increased trouble breathing.  You have been given some antibiotics to help with the infection on your face.
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
## 2024-03-09 NOTE — ED Notes (Signed)
 Patient provided with discharge paperwork. Pt demonstrated understanding of material. All questions, comments, and concerns addressed. Pt assisted with wheelchair
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
## 2024-03-11 ENCOUNTER — Encounter (HOSPITAL_COMMUNITY): Payer: Self-pay | Admitting: Emergency Medicine

## 2024-03-11 ENCOUNTER — Emergency Department (HOSPITAL_COMMUNITY)
Admission: EM | Admit: 2024-03-11 | Discharge: 2024-03-12 | Discharge status: Against Medical Advice | Payer: Self-pay | Attending: Emergency Medicine | Admitting: Emergency Medicine

## 2024-03-11 ENCOUNTER — Emergency Department (HOSPITAL_COMMUNITY): Payer: Self-pay

## 2024-03-11 DIAGNOSIS — Z5321 Procedure and treatment not carried out due to patient leaving prior to being seen by health care provider: Secondary | ICD-10-CM | POA: Insufficient documentation

## 2024-03-11 DIAGNOSIS — I10 Essential (primary) hypertension: Secondary | ICD-10-CM | POA: Insufficient documentation

## 2024-03-11 LAB — BASIC METABOLIC PANEL WITH GFR
Anion gap: 12 (ref 5–15)
BUN: 8 mg/dL (ref 6–20)
CO2: 23 mmol/L (ref 22–32)
Calcium: 9.6 mg/dL (ref 8.9–10.3)
Chloride: 102 mmol/L (ref 98–111)
Creatinine, Ser: 1.14 mg/dL (ref 0.61–1.24)
GFR, Estimated: >60 mL/min (ref >60)
Glucose, Bld: 118 mg/dL — ABNORMAL HIGH (ref 70–99)
Potassium: 3.9 mmol/L (ref 3.5–5.1)
Sodium: 137 mmol/L (ref 135–145)

## 2024-03-11 LAB — CBC
HCT: 42.1 % (ref 39.0–52.0)
Hemoglobin: 13.6 g/dL (ref 13.0–17.0)
MCH: 28.6 pg (ref 26.0–34.0)
MCHC: 32.3 g/dL (ref 30.0–36.0)
MCV: 88.6 fL (ref 80.0–100.0)
Platelets: 207 10*3/uL (ref 150–400)
RBC: 4.75 MIL/uL (ref 4.22–5.81)
RDW: 13.2 % (ref 11.5–15.5)
WBC: 10.9 10*3/uL — ABNORMAL HIGH (ref 4.0–10.5)
nRBC: 0 % (ref 0.0–0.2)

## 2024-03-11 MED ORDER — IPRATROPIUM-ALBUTEROL 0.5-2.5 (3) MG/3ML IN SOLN
3.0000 mL | Freq: Once | RESPIRATORY_TRACT | Status: AC
  Administered 2024-03-11: 3 mL via RESPIRATORY_TRACT
  Filled 2024-03-11: qty 3

## 2024-03-11 NOTE — ED Triage Notes (Signed)
 Pt here from home with c/o sob and htn along with some nausea , per ems pt was at the Ed yesterday

## 2024-03-11 NOTE — ED Notes (Signed)
 Called for vitals no answer

## 2024-03-12 ENCOUNTER — Emergency Department (HOSPITAL_COMMUNITY)
Admission: EM | Admit: 2024-03-12 | Discharge: 2024-03-12 | Disposition: A | Discharge status: Home / Self Care | Payer: Self-pay | Attending: Emergency Medicine | Admitting: Emergency Medicine

## 2024-03-12 DIAGNOSIS — Z760 Encounter for issue of repeat prescription: Secondary | ICD-10-CM

## 2024-03-12 DIAGNOSIS — M7918 Myalgia, other site: Secondary | ICD-10-CM

## 2024-03-12 MED ORDER — METHOCARBAMOL 500 MG PO TABS
500.0000 mg | ORAL_TABLET | Freq: Three times a day (TID) | ORAL | 0 refills | Status: AC | PRN
Start: 2024-03-12 — End: ?

## 2024-03-12 NOTE — ED Notes (Signed)
 Discharge paperwork gone over with education on prescription medication. Patient denies any further needs. Denies questions at this time.

## 2024-03-12 NOTE — ED Triage Notes (Signed)
 Pt. Stated, I was here the other day and left cause Id been here for 7 hours. I need a work note and medication refill
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
## 2024-03-12 NOTE — ED Provider Notes (Signed)
 [place] EMERGENCY DEPARTMENT AT [facility] Provider Note   CSN: [id number] Arrival date & time: 03/12/24  0825     Patient presents with: work note and Medication Refill   Antonio Andersen is a 43 y.o. male.   Patient request a prescription for a muscle relaxer.  Patient was seen here recently for the same but he did not get the prescription.  Patient states that he has been having muscle aches.  Patient missed work yesterday and today.  He states that he needs a work note.  Patient states he came to the emergency department yesterday but the wait was too long so he decided to come today.  Patient denies any fever he has not had any shortness of breath.  Patient states that he recently was on an antibiotic for a skin infection he states he did finish this.  The history is provided by the patient. No language interpreter was used.  Medication Refill      Prior to Admission medications   Medication Sig Start Date End Date Taking? Authorizing Provider  doxycycline  (VIBRAMYCIN ) 100 MG capsule Take 1 capsule (100 mg total) by mouth 2 (two) times daily. 03/09/24   Mozell Arias, MD  methocarbamol  (ROBAXIN ) 500 MG tablet Take 1 tablet (500 mg total) by mouth every 8 (eight) hours as needed. 03/12/24   Angles Trevizo K, PA-C    Allergies: Patient has no known allergies.    Review of Systems  All other systems reviewed and are negative.   Updated Vital Signs BP 133/82   Pulse 89   Temp 98.7 F (37.1 C)   Resp 15   SpO2 97%   Physical Exam Vitals and nursing note reviewed.  Constitutional:      General: He is not in acute distress.    Appearance: He is well-developed.  HENT:     Head: Normocephalic and atraumatic.   Eyes:     Conjunctiva/sclera: Conjunctivae normal.    Cardiovascular:     Rate and Rhythm: Normal rate and regular rhythm.     Heart sounds: No murmur heard. Pulmonary:     Effort: Pulmonary effort is normal. No respiratory distress.      Breath sounds: Normal breath sounds.  Abdominal:     Palpations: Abdomen is soft.     Tenderness: There is no abdominal tenderness.   Musculoskeletal:        General: No swelling.     Cervical back: Neck supple.   Skin:    General: Skin is warm and dry.     Capillary Refill: Capillary refill takes less than 2 seconds.   Neurological:     Mental Status: He is alert.   Psychiatric:        Mood and Affect: Mood normal.     (all labs ordered are listed, but only abnormal results are displayed) Labs Reviewed - No data to display  EKG: None  Radiology: DG Chest 2 View Result Date: 03/11/2024 CLINICAL DATA:  sob EXAM: CHEST - 2 VIEW COMPARISON:  Chest x-ray 03/09/2024 FINDINGS: The heart and mediastinal contours are within normal limits. No focal consolidation. No pulmonary edema. No pleural effusion. No pneumothorax. No acute osseous abnormality. IMPRESSION: No active cardiopulmonary disease. Electronically Signed   By: Morgane  Naveau M.D.   On: 03/11/2024 15:59     Procedures   Medications Ordered in the ED - No data to display  Medical Decision Making Patient is here requesting a prescription for a muscle relaxer and a work note  Risk Prescription drug management. Risk Details: Patient is given a prescription for Robaxin .  He is given a note for work.  He is advised to return if any problems.        Final diagnoses:  Medication refill  Myalgia, multiple sites    ED Discharge Orders          Ordered    methocarbamol  (ROBAXIN ) 500 MG tablet  Every 8 hours PRN        03/12/24 1327           An After Visit Summary was printed and given to the patient.     Sandi Crosby, PA-C 03/12/24 1441    Kingsley, Victoria K, DO 03/12/24 1459
